# Patient Record
Sex: Male | Born: 1985 | Race: Black or African American | Hispanic: No | Marital: Single | Smoking: Former smoker
Health system: Southern US, Community
[De-identification: ages and names within clinical notes are randomized; demographics above are authoritative.]

## PROBLEM LIST (undated history)

## (undated) DIAGNOSIS — N200 Calculus of kidney: Secondary | ICD-10-CM

## (undated) DIAGNOSIS — I10 Essential (primary) hypertension: Secondary | ICD-10-CM

---

## 2010-07-22 ENCOUNTER — Emergency Department (HOSPITAL_COMMUNITY)
Admission: EM | Admit: 2010-07-22 | Discharge: 2010-07-22 | Payer: Self-pay | Source: Home / Self Care | Admitting: Emergency Medicine

## 2011-09-15 ENCOUNTER — Emergency Department (HOSPITAL_COMMUNITY)
Admission: EM | Admit: 2011-09-15 | Discharge: 2011-09-15 | Disposition: A | Discharge status: Home / Self Care | Payer: Managed Care, Other (non HMO) | Attending: Emergency Medicine | Admitting: Emergency Medicine

## 2011-09-15 ENCOUNTER — Encounter (HOSPITAL_COMMUNITY): Payer: Self-pay | Admitting: *Deleted

## 2011-09-15 DIAGNOSIS — N390 Urinary tract infection, site not specified: Secondary | ICD-10-CM | POA: Insufficient documentation

## 2011-09-15 DIAGNOSIS — R300 Dysuria: Secondary | ICD-10-CM | POA: Insufficient documentation

## 2011-09-15 LAB — URINALYSIS, ROUTINE W REFLEX MICROSCOPIC
Glucose, UA: NEGATIVE mg/dL
Nitrite: NEGATIVE
Protein, ur: NEGATIVE mg/dL
Urobilinogen, UA: 0.2 mg/dL (ref 0.0–1.0)

## 2011-09-15 LAB — URINE MICROSCOPIC-ADD ON

## 2011-09-15 MED ORDER — CEPHALEXIN 500 MG PO CAPS: 500.0000 mg | ORAL_CAPSULE | Freq: Four times a day (QID) | ORAL | Status: AC

## 2011-09-15 NOTE — ED Provider Notes (Signed)
Medical screening examination/treatment/procedure(s) were performed by non-physician practitioner and as supervising physician I was immediately available for consultation/collaboration.  Toy Baker, MD 09/15/11 [phone]

## 2011-09-15 NOTE — Discharge Instructions (Signed)
Your urine appears infected. I have sent cultures, if they return and you require additional treatment, you will be contacted by phone. Take keflex as prescribed until all gone. Follow up with your doctor in 1 week for recheck. Return if worsening.   Urinary Tract Infection Infections of the urinary tract can start in several places. A bladder infection (cystitis), a kidney infection (pyelonephritis), and a prostate infection (prostatitis) are different types of urinary tract infections (UTIs). They usually get better if treated with medicines (antibiotics) that kill germs. Take all the medicine until it is gone. You or your child may feel better in a few days, but TAKE ALL MEDICINE or the infection may not respond and may become more difficult to treat. HOME CARE INSTRUCTIONS   Drink enough water and fluids to keep the urine clear or pale yellow. Cranberry juice is especially recommended, in addition to large amounts of water.   Avoid caffeine, tea, and carbonated beverages. They tend to irritate the bladder.   Alcohol may irritate the prostate.   Only take over-the-counter or prescription medicines for pain, discomfort, or fever as directed by your caregiver.  To prevent further infections:  Empty the bladder often. Avoid holding urine for long periods of time.   After a bowel movement, women should cleanse from front to back. Use each tissue only once.   Empty the bladder before and after sexual intercourse.  FINDING OUT THE RESULTS OF YOUR TEST Not all test results are available during your visit. If your or your child's test results are not back during the visit, make an appointment with your caregiver to find out the results. Do not assume everything is normal if you have not heard from your caregiver or the medical facility. It is important for you to follow up on all test results. SEEK MEDICAL CARE IF:   There is back pain.   Your baby is older than 3 months with a rectal  temperature of 100.5 F (38.1 C) or higher for more than 1 day.   Your or your child's problems (symptoms) are no better in 3 days. Return sooner if you or your child is getting worse.  SEEK IMMEDIATE MEDICAL CARE IF:   There is severe back pain or lower abdominal pain.   You or your child develops chills.   You have a fever.   Your baby is older than 3 months with a rectal temperature of 102 F (38.9 C) or higher.   Your baby is 38 months old or younger with a rectal temperature of 100.4 F (38 C) or higher.   There is nausea or vomiting.   There is continued burning or discomfort with urination.  MAKE SURE YOU:   Understand these instructions.   Will watch your condition.   Will get help right away if you are not doing well or get worse.  Document Released: 03/20/2005 Document Revised: 05/30/2011 Document Reviewed: 10/23/2006 [facility] Patient Information 2012 [place], [place].

## 2011-09-15 NOTE — ED Provider Notes (Signed)
History     CSN: [id number]  Arrival date & time 09/15/11  1924   First MD Initiated Contact with Patient 09/15/11 2107      Chief Complaint  Patient presents with  . Dysuria    burns when he urinates,  no discharge noted,  no unprotected sex    (Consider location/radiation/quality/duration/timing/severity/associated sxs/prior treatment) Patient is a 26 y.o. male presenting with dysuria. The history is provided by the patient.  Dysuria  This is a new problem. The current episode started 6 to 12 hours ago. The problem occurs every urination. The problem has not changed since onset.The quality of the pain is described as burning. The pain is at a severity of 0/10. There has been no fever. Pertinent negatives include no chills, no vomiting, no discharge, no frequency, no hematuria, no hesitancy and no flank pain.  Pt states burning with urination started today. He states he has not had an intercourse in "years." Denies penile discharge. Denies testicular pain. Denies abdominal pain, back pain, flank pain.   No past medical history on file.  No past surgical history on file.  History reviewed. No pertinent family history.  History  Substance Use Topics  . Smoking status: Not on file  . Smokeless tobacco: Not on file  . Alcohol Use: No      Review of Systems  Constitutional: Negative for fever and chills.  HENT: Negative.   Eyes: Negative.   Respiratory: Negative.   Cardiovascular: Negative.   Gastrointestinal: Negative.  Negative for vomiting.  Genitourinary: Positive for dysuria. Negative for hesitancy, frequency, hematuria, flank pain, discharge, penile swelling and penile pain.  Musculoskeletal: Negative.   Skin: Negative.   Neurological: Negative.   Psychiatric/Behavioral: Negative.     Allergies  Review of patient's allergies indicates no known allergies.  Home Medications  No current outpatient prescriptions on file.  BP 165/76  Pulse 82  Temp(Src) 99.7 F  (37.6 C) (Oral)  Resp 20  SpO2 100%  Physical Exam  Nursing note and vitals reviewed. Constitutional: He is oriented to person, place, and time. He appears well-developed and well-nourished.  HENT:  Head: Normocephalic.  Eyes: Conjunctivae are normal.  Neck: Neck supple.  Cardiovascular: Normal rate, regular rhythm and normal heart sounds.   Pulmonary/Chest: Effort normal and breath sounds normal. No respiratory distress.  Abdominal: Soft. Bowel sounds are normal. He exhibits no distension. There is no tenderness.  Genitourinary: Penis normal.  Musculoskeletal: Normal range of motion.  Neurological: He is alert and oriented to person, place, and time.  Skin: Skin is warm and dry.  Psychiatric: He has a normal mood and affect.    ED Course  Procedures (including critical care time)  UA with 11-20 WBCs, will treat for UTI. Pt denies being sexually active. Sent GC/chlamydia and urine cultures. Explained if they return abnormal he will be contacted and he will need to be treated. Pt agrees with the plan. He has no fever, chills, nausea, vomiting, abdominal pain, flank pain. Results for orders placed during the hospital encounter of 09/15/11  URINALYSIS, ROUTINE W REFLEX MICROSCOPIC      Component Value Range   Color, Urine YELLOW  YELLOW    APPearance CLOUDY (*) CLEAR    Specific Gravity, Urine 1.020  1.005 - 1.030    pH 7.0  5.0 - 8.0    Glucose, UA NEGATIVE  NEGATIVE (mg/dL)   Hgb urine dipstick NEGATIVE  NEGATIVE    Bilirubin Urine NEGATIVE  NEGATIVE    Ketones,  ur NEGATIVE  NEGATIVE (mg/dL)   Protein, ur NEGATIVE  NEGATIVE (mg/dL)   Urobilinogen, UA 0.2  0.0 - 1.0 (mg/dL)   Nitrite NEGATIVE  NEGATIVE    Leukocytes, UA SMALL (*) NEGATIVE   URINE MICROSCOPIC-ADD ON      Component Value Range   WBC, UA 11-20  <3 (WBC/hpf)   No results found.    No diagnosis found.    MDM          Lottie Mussel, PA 09/15/11 2244

## 2011-09-15 NOTE — ED Notes (Signed)
Only burns when he urinates,  Started at church this morning,  No unprotected sex,  No discharge noted

## 2011-09-17 LAB — URINE CULTURE

## 2011-09-19 NOTE — ED Notes (Signed)
+   Gonorrhea. Patient not treated. Chart sent to EDP office for review.

## 2011-09-20 NOTE — ED Notes (Signed)
RX called to [facility] on [address] [phone]). RX called in by [facility].

## 2013-07-04 ENCOUNTER — Ambulatory Visit: Payer: Self-pay | Admitting: Family Medicine

## 2013-07-04 VITALS — BP 124/82 | HR 103 | Temp 99.4°F | Resp 18 | Ht 77.5 in | Wt 332.4 lb

## 2013-07-04 DIAGNOSIS — J029 Acute pharyngitis, unspecified: Secondary | ICD-10-CM

## 2013-07-04 MED ORDER — AMOXICILLIN-POT CLAVULANATE 875-125 MG PO TABS: 1.0000 | ORAL_TABLET | Freq: Two times a day (BID) | ORAL | Status: DC

## 2013-07-04 NOTE — Progress Notes (Signed)
Patient ID: Charles CousinsMichael Barry MRN: [id number], DOB: 02-21-1986, 28 y.o. Date of Encounter: 07/04/2013, 1:04 PM This chart was scribed for Elvina SidleKurt Lauenstein, MD by Nicholos Johnsenise Iheanachor, Medical Scribe. This patient's care was started at 1:04 PM  Primary Physician: Provider Not In System  Chief Complaint: sore throat, nasal congestion  HPI: 28 y.o. year old male with history below presents with sore throat and associated ear pain and mucosal production, onset 3 days ago. Pt has been taking some OTC remedies with no relief. Pt took 1-2 amoxicillin pills he got from a friend that provided mild relief. Denies cough, abdominal pain. Pt had a sick contact that tested positive for Strep throat about a month ago. Pt works for [employer].  History reviewed. No pertinent past medical history.   Home Meds: Prior to Admission medications   Medication Sig Start Date End Date Taking? Authorizing Provider  acetaminophen (TYLENOL) 500 MG tablet Take 500 mg by mouth every 6 (six) hours as needed.   Yes Historical Provider, MD    Allergies: No Known Allergies  History   Social History   Marital Status: Single    Spouse Name: N/A    Number of Children: N/A   Years of Education: N/A   Occupational History   Not on file.   Social History Main Topics   Smoking status: Former Smoker -- 0.00 packs/day   Smokeless tobacco: Not on file   Alcohol Use: No   Drug Use: No   Sexual Activity: No   Other Topics Concern   Not on file   Social History Narrative   No narrative on file     Review of Systems: Constitutional: negative for chills, fever, night sweats, weight changes, or fatigue  HEENT: negative for vision changes, hearing loss, rhinorrhea, ST, epistaxis, or sinus pressure. Positive for sore throat and nasal congestion. Cardiovascular: negative for chest pain or palpitations Respiratory: negative for hemoptysis, wheezing, shortness of breath, or cough Abdominal: negative  for abdominal pain, nausea, vomiting, diarrhea, or constipation Dermatological: negative for rash Neurologic: negative for headache, dizziness, or syncope All other systems reviewed and are otherwise negative with the exception to those above and in the HPI.   Physical Exam Blood pressure 124/82, pulse 103, temperature 99.4 F (37.4 C), temperature source Oral, resp. rate 18, height 6' 5.5" (1.969 m), weight 332 lb 6.4 oz (150.776 kg), SpO2 99.00%., Body mass index is 38.89 kg/(m^2). General: Well developed, well nourished, in no acute distress. Head: Normocephalic, atraumatic, eyes without discharge, sclera non-icteric, nares are without discharge. Bilateral auditory canals clear, TM's are without perforation, pearly grey and translucent with reflective cone of light bilaterally. Oral cavity moist, posterior pharynx without exudate, but he does have erythema and post nasal drip. There is some swelling left posterior pharynx. Neck: Supple. No thyromegaly. Full ROM. Mild left anterior cervical lymphadenopathy. Lungs: Clear bilaterally to auscultation without wheezes, rales, or rhonchi. Breathing is unlabored. Heart: RRR with S1 S2. No murmurs, rubs, or gallops appreciated. Abdomen: Soft, non-tender, non-distended with normoactive bowel sounds. No hepatomegaly. No rebound/guarding. No obvious abdominal masses. Msk:  Strength and tone normal for age. Extremities/Skin: Warm and dry. No clubbing or cyanosis. No edema. No rashes or suspicious lesions. Neuro: Alert and oriented X 3. Moves all extremities spontaneously. Gait is normal. CNII-XII grossly in tact. Psych:  Responds to questions appropriately with a normal affect.   Labs:   ASSESSMENT AND PLAN:  28 y.o. year old male with Acute pharyngitis - Plan: amoxicillin-clavulanate (AUGMENTIN)  875-125 MG per tablet  Signed, Elvina Sidle, MD 07/04/2013 12:59 PM  HPI Review of Systems  Physical Exam

## 2013-07-04 NOTE — Patient Instructions (Signed)
Pharyngitis °Pharyngitis is redness, pain, and swelling (inflammation) of your pharynx.  °CAUSES  °Pharyngitis is usually caused by infection. Most of the time, these infections are from viruses (viral) and are part of a cold. However, sometimes pharyngitis is caused by bacteria (bacterial). Pharyngitis can also be caused by allergies. Viral pharyngitis may be spread from person to person by coughing, sneezing, and personal items or utensils (cups, forks, spoons, toothbrushes). Bacterial pharyngitis may be spread from person to person by more intimate contact, such as kissing.  °SIGNS AND SYMPTOMS  °Symptoms of pharyngitis include:   °· Sore throat.   °· Tiredness (fatigue).   °· Low-grade fever.   °· Headache. °· Joint pain and muscle aches. °· Skin rashes. °· Swollen lymph nodes. °· Plaque-like film on throat or tonsils (often seen with bacterial pharyngitis). °DIAGNOSIS  °Your health care provider will ask you questions about your illness and your symptoms. Your medical history, along with a physical exam, is often all that is needed to diagnose pharyngitis. Sometimes, a rapid strep test is done. Other lab tests may also be done, depending on the suspected cause.  °TREATMENT  °Viral pharyngitis will usually get better in 3 4 days without the use of medicine. Bacterial pharyngitis is treated with medicines that kill germs (antibiotics).  °HOME CARE INSTRUCTIONS  °· Drink enough water and fluids to keep your urine clear or pale yellow.   °· Only take over-the-counter or prescription medicines as directed by your health care provider:   °· If you are prescribed antibiotics, make sure you finish them even if you start to feel better.   °· Do not take aspirin.   °· Get lots of rest.   °· Gargle with 8 oz of salt water (½ tsp of salt per 1 qt of water) as often as every 1 2 hours to soothe your throat.   °· Throat lozenges (if you are not at risk for choking) or sprays may be used to soothe your throat. °SEEK MEDICAL  CARE IF:  °· You have large, tender lumps in your neck. °· You have a rash. °· You cough up green, yellow-brown, or bloody spit. °SEEK IMMEDIATE MEDICAL CARE IF:  °· Your neck becomes stiff. °· You drool or are unable to swallow liquids. °· You vomit or are unable to keep medicines or liquids down. °· You have severe pain that does not go away with the use of recommended medicines. °· You have trouble breathing (not caused by a stuffy nose). °MAKE SURE YOU:  °· Understand these instructions. °· Will watch your condition. °· Will get help right away if you are not doing well or get worse. °Document Released: 06/10/2005 Document Revised: 03/31/2013 Document Reviewed: 02/15/2013 °Exit[employer]® Patient Information ©2014 [employer]. ° °

## 2013-10-25 ENCOUNTER — Other Ambulatory Visit: Payer: Self-pay | Admitting: Family Medicine

## 2013-10-26 ENCOUNTER — Telehealth: Payer: Self-pay

## 2013-10-26 NOTE — Telephone Encounter (Signed)
Patient is experiencing allergy symptoms. Would like another amoxicillin RX if possible. He knows this has been denied and that he needs an OV before he can get this med. He wants to know if an exception can be made.  [phone]

## 2013-10-26 NOTE — Telephone Encounter (Signed)
Spoke with pt. Recommended suggestions for allergies. He is already taking Zyrtec. Advised Nasacort and Mucinex. Will try this and will come in if this doesn't help.

## 2013-10-26 NOTE — Telephone Encounter (Signed)
Unfortunately not. Pt must RTC. Unable to LM. VM not set up

## 2013-10-27 ENCOUNTER — Ambulatory Visit (INDEPENDENT_AMBULATORY_CARE_PROVIDER_SITE_OTHER): Payer: BC Managed Care – PPO | Admitting: Physician Assistant

## 2013-10-27 VITALS — BP 120/76 | HR 91 | Temp 100.1°F | Resp 16 | Ht 76.5 in | Wt 341.0 lb

## 2013-10-27 DIAGNOSIS — J329 Chronic sinusitis, unspecified: Secondary | ICD-10-CM

## 2013-10-27 MED ORDER — IPRATROPIUM BROMIDE 0.03 % NA SOLN: 2.0000 | Freq: Two times a day (BID) | NASAL | Status: DC

## 2013-10-27 MED ORDER — GUAIFENESIN ER 1200 MG PO TB12: 1.0000 | ORAL_TABLET | Freq: Two times a day (BID) | ORAL | Status: DC | PRN

## 2013-10-27 MED ORDER — AMOXICILLIN 875 MG PO TABS: 1750.0000 mg | ORAL_TABLET | Freq: Two times a day (BID) | ORAL | Status: DC

## 2013-10-27 NOTE — Patient Instructions (Signed)
Get plenty of rest and drink at least 64 ounces of water daily. You may continue the Zyrtec, Emergen-C, etc.

## 2013-10-27 NOTE — Progress Notes (Signed)
   Subjective:    Patient ID: Charles CousinsMichael Cutler, male    DOB: [birth year], 28 y.o.   MRN: [id number]  Sinusitis Associated symptoms include congestion, ear pain, headaches and a sore throat. Pertinent negatives include no chills, coughing, diaphoresis or shortness of breath.    28y.o otherwise healthy male presents with 4 days of congestion and sore throat.  Started with congestion and HA and progressed to sore throat with congestion.  Associated ear pressure and rhinorrhea.  Has tried nyquil, emergency C and tylenol.  Has been allowing more time for sleep and has increase intake of water.  Took amoxicillin that was left over from january visit, which "almost cleared infection."   Denies generalized body aches, cough, SOB, loss of appetite, N/V/D, abd pain.  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    Review of Systems  Constitutional: Positive for fever. Negative for chills, diaphoresis, appetite change and fatigue.  HENT: Positive for congestion, ear pain, postnasal drip and sore throat.   Eyes: Negative.   Respiratory: Negative for cough, chest tightness, shortness of breath and wheezing.   Cardiovascular: Negative for chest pain and leg swelling.  Gastrointestinal: Negative for nausea, vomiting, abdominal pain and diarrhea.  Endocrine: Negative.   Genitourinary: Negative.   Musculoskeletal: Negative.   Skin: Negative for rash.  Allergic/Immunologic: Negative.   Neurological: Positive for headaches. Negative for dizziness and light-headedness.  Hematological: Negative.        Objective:   Physical Exam  Constitutional: He is oriented to person, place, and time. He appears well-developed and well-nourished. No distress.  BP 120/76  Pulse 91  Temp(Src) 100.1 F (37.8 C) (Oral)  Resp 16  Ht 6' 4.5" (1.943 m)  Wt 341 lb (154.677 kg)  BMI 40.97 kg/m2  SpO2 96%   HENT:  Head: Normocephalic.  Right Ear: Tympanic membrane and external ear normal.   Left Ear: Tympanic membrane and external ear normal.  Nose: Mucosal edema present.  Mouth/Throat: Oropharyngeal exudate, posterior oropharyngeal edema and posterior oropharyngeal erythema present.  Eyes: Conjunctivae are normal. Pupils are equal, round, and reactive to light.  Neck: Normal range of motion.  Cardiovascular: Normal rate, regular rhythm, normal heart sounds and intact distal pulses.  Exam reveals no gallop and no friction rub.   No murmur heard. Pulmonary/Chest: Effort normal and breath sounds normal. No respiratory distress. He has no wheezes. He exhibits no tenderness.  Lymphadenopathy:    He has cervical adenopathy.  Neurological: He is alert and oriented to person, place, and time.  Skin: Skin is warm and dry. No rash noted.  Psychiatric: He has a normal mood and affect. His behavior is normal.          Assessment & Plan:   1. Sinusitis Probable sinus infection.  Pt will return if symptoms fail to improve after 10 days of symptom onset. - ipratropium (ATROVENT) 0.03 % nasal spray; Place 2 sprays into both nostrils 2 (two) times daily.  Dispense: 30 mL; Refill: 0 - Guaifenesin (MUCINEX MAXIMUM STRENGTH) 1200 MG TB12; Take 1 tablet (1,200 mg total) by mouth every 12 (twelve) hours as needed.  Dispense: 14 tablet; Refill: 1 - amoxicillin (AMOXIL) 875 MG tablet; Take 2 tablets (1,750 mg total) by mouth 2 (two) times daily.  Dispense: 20 tablet; Refill: 0

## 2013-10-30 NOTE — Progress Notes (Signed)
I have examined this patient along with the student and agree.  Discussed the importance of completing the course of antibiotics, not leaving any left over, not taking any remaining tablets without instruction by a provider, and returning to the office should his symptoms persist after treatment.

## 2014-03-28 ENCOUNTER — Ambulatory Visit (INDEPENDENT_AMBULATORY_CARE_PROVIDER_SITE_OTHER): Payer: BC Managed Care – PPO | Admitting: Family Medicine

## 2014-03-28 VITALS — BP 128/70 | HR 90 | Temp 97.9°F | Resp 16 | Ht 77.5 in | Wt 352.0 lb

## 2014-03-28 DIAGNOSIS — L209 Atopic dermatitis, unspecified: Secondary | ICD-10-CM

## 2014-03-28 MED ORDER — CLOBETASOL PROPIONATE 0.05 % EX CREA: 1.0000 "application " | TOPICAL_CREAM | Freq: Two times a day (BID) | CUTANEOUS | Status: DC

## 2014-03-28 NOTE — Patient Instructions (Signed)
General measures for eczema - In clinical experience, avoidance of irritants or exacerbating factors is beneficial for most patients with eczema. General skin care measures aimed at reducing skin irritation and restoring the skin barrier include:  Using lukewarm water and soap-free cleansers to wash hands  Drying hands thoroughly after washing  Applying emollients (eg, petroleum jelly) immediately after hand drying and as often as possible  Wearing cotton gloves under vinyl or other nonlatex gloves when performing wet work  Removing rings and watches and bracelets before wet work  Wearing protective gloves in cold weather  Wearing task-specific gloves for frictional exposures (eg, gardening, carpentry)  Avoiding exposure to irritants (eg, detergents, solvents, hair lotions or dyes, acidic foods [eg, citrus fruit])   Apply the clobetasol cream to your leg and spots on arm until those rashes are completely gone and then switch over to good hypoallergenic thick moisturizer cream such as Eucerin, Cedaphil, or Aquaphor and apply this continually twice a day - especially immediately after showering to prevent it from coming back.   Eczema Eczema, also called atopic dermatitis, is a skin disorder that causes inflammation of the skin. It causes a red rash and dry, scaly skin. The skin becomes very itchy. Eczema is generally worse during the cooler winter months and often improves with the warmth of summer. Eczema usually starts showing signs in infancy. Some children outgrow eczema, but it may last through adulthood.  CAUSES  The exact cause of eczema is not known, but it appears to run in families. People with eczema often have a family history of eczema, allergies, asthma, or hay fever. Eczema is not contagious. Flare-ups of the condition may be caused by:   Contact with something you are sensitive or allergic to.   Stress. SIGNS AND SYMPTOMS  Dry, scaly skin.   Red, itchy rash.   Itchiness.  This may occur before the skin rash and may be very intense.  DIAGNOSIS  The diagnosis of eczema is usually made based on symptoms and medical history. TREATMENT  Eczema cannot be cured, but symptoms usually can be controlled with treatment and other strategies. A treatment plan might include:  Controlling the itching and scratching.   Use over-the-counter antihistamines as directed for itching. This is especially useful at night when the itching tends to be worse.   Use over-the-counter steroid creams as directed for itching.   Avoid scratching. Scratching makes the rash and itching worse. It may also result in a skin infection (impetigo) due to a break in the skin caused by scratching.   Keeping the skin well moisturized with creams every day. This will seal in moisture and help prevent dryness. Lotions that contain alcohol and water should be avoided because they can dry the skin.   Limiting exposure to things that you are sensitive or allergic to (allergens).   Recognizing situations that cause stress.   Developing a plan to manage stress.  HOME CARE INSTRUCTIONS   Only take over-the-counter or prescription medicines as directed by your health care provider.   Do not use anything on the skin without checking with your health care provider.   Keep baths or showers short (5 minutes) in warm (not hot) water. Use mild cleansers for bathing. These should be unscented. You may add nonperfumed bath oil to the bath water. It is best to avoid soap and bubble bath.   Immediately after a bath or shower, when the skin is still damp, apply a moisturizing ointment to the entire body.  This ointment should be a petroleum ointment. This will seal in moisture and help prevent dryness. The thicker the ointment, the better. These should be unscented.   Keep fingernails cut short. Children with eczema may need to wear soft gloves or mittens at night after applying an ointment.   Dress  in clothes made of cotton or cotton blends. Dress lightly, because heat increases itching.   A child with eczema should stay away from anyone with fever blisters or cold sores. The virus that causes fever blisters (herpes simplex) can cause a serious skin infection in children with eczema. SEEK MEDICAL CARE IF:   Your itching interferes with sleep.   Your rash gets worse or is not better within 1 week after starting treatment.   You see pus or soft yellow scabs in the rash area.   You have a fever.   You have a rash flare-up after contact with someone who has fever blisters.  Document Released: 06/07/2000 Document Revised: 03/31/2013 Document Reviewed: 01/11/2013 [facility] Patient Information 2015 [employer] [place]. This information is not intended to replace advice given to you by your health care provider. Make sure you discuss any questions you have with your health care provider.

## 2014-03-30 ENCOUNTER — Telehealth: Payer: Self-pay

## 2014-03-30 NOTE — Telephone Encounter (Signed)
[facility] lists approximate retail price of 60 g of this product as $70-90/tube. That's still quite expensive, but much less than what he's being charged.  Does he have a high deductible on prescription benefits? (he's listed as self-pay for the visit here 03/28/14, but the pharmacy has him with insurance). If he HAS insurance, he should contact the pharmacy benefit manager number on his card and inquire.  Perhaps they have a preferred product that is a lower cost?  In the meantime, he can use OTC hydrocortisone cream/ointment.

## 2014-03-30 NOTE — Telephone Encounter (Signed)
Patient went to [facility] to get his prescription "Clobetasol Propionate" and it will cost him $193. Patient stated he can not afford this amount and requesting a different medication to be sent to [facility] on [address]. Patients call back number is [phone].

## 2014-03-31 MED ORDER — TRIAMCINOLONE ACETONIDE 0.1 % EX CREA
1.0000 | TOPICAL_CREAM | Freq: Four times a day (QID) | CUTANEOUS | Status: DC
Start: 2014-03-31 — End: 2015-06-06

## 2014-03-31 NOTE — Progress Notes (Signed)
Subjective:  This chart was scribed for Norberto Sorenson by Elon Spanner, Medical Scribe. This patient was seen in room Rm 10 and the patient's care was started at 6:37 PM.   Patient ID: Charles Barry, male    DOB: 1985-12-27, 28 y.o.   MRN: [id number] Chief Complaint  Patient presents with  . Rash     Rash   HPI Comments: Charles Barry is a 28 y.o. male who presents to the Emergency Department complaining of an improving itchy patches of rash on his leg and right arm with recent improvement onset 2 weeks ago.  Patient reports he was seen for this complaint by nurses at his [facility] who advised him that it could be eczema and told him to use a cold, damp towel on the area infused with aquafor and cortisone.  He noticed improvement with this treatment but decided to come to [facility] today to have his rash looked at.  Patient denies prior similar episodes.  Patient states he may have changed his body wash prior to the rash, however, he recently changed back to an oatmeal scrub wash.  Patient denies history of eczema as a child.  Patient denies having a new pet.  Patient denies blistering, bumps, pustules.  Patient reports he has had abnormal allergies this year and is concerned his complaint could be an allergy  History reviewed. No pertinent past medical history. Current Outpatient Prescriptions on File Prior to Visit  Medication Sig Dispense Refill  . acetaminophen (TYLENOL) 500 MG tablet Take 500 mg by mouth every 6 (six) hours as needed.      . Guaifenesin (MUCINEX MAXIMUM STRENGTH) 1200 MG TB12 Take 1 tablet (1,200 mg total) by mouth every 12 (twelve) hours as needed.  14 tablet  1  . ipratropium (ATROVENT) 0.03 % nasal spray Place 2 sprays into both nostrils 2 (two) times daily.  30 mL  0   No current facility-administered medications on file prior to visit.   No Known Allergies    Review of Systems  Skin: Positive for rash.       Objective:  BP 128/70  Pulse 90  Temp(Src) 97.9  F (36.6 C) (Oral)  Resp 16  Ht 6' 5.5" (1.969 m)  Wt 352 lb (159.666 kg)  BMI 41.18 kg/m2  SpO2 99%  Physical Exam  Nursing note and vitals reviewed. Constitutional: He is oriented to person, place, and time. He appears well-developed and well-nourished. No distress.  HENT:  Head: Normocephalic and atraumatic.  Eyes: Conjunctivae and EOM are normal.  Neck: Neck supple. No tracheal deviation present.  Cardiovascular: Normal rate.   Pulmonary/Chest: Effort normal. No respiratory distress.  Musculoskeletal: Normal range of motion.  Neurological: He is alert and oriented to person, place, and time.  Skin: Skin is warm and dry.  Hyperpigmented, rough, mildly elevated plaque with small amount of scale, round well-circumscribed lesions.    Psychiatric: He has a normal mood and affect. His behavior is normal.          Assessment & Plan:  6:46 PM Advised patient he will be prescribed steroid cream to be used twice per day immediately after cold shower.  Discontinue once ebbing of his rash observed.  After this time, patient is advised to continue to use a thick moisturizer.   Atopic dermatitis ADDENDUM:  Clobetasol to expensive so pt switched to triamcinolone.  Meds ordered this encounter  Medications  . cetirizine (ZYRTEC) 10 MG chewable tablet    Sig: Chew 10 mg  by mouth daily.  . Ascorbic Acid (VITAMIN C) 1000 MG tablet    Sig: Take 1,000 mg by mouth daily.  . clobetasol cream (TEMOVATE) 0.05 %    Sig: Apply 1 application topically 2 (two) times daily.    Dispense:  60 g    Refill:  1    I personally performed the services described in this documentation, which was scribed in my presence. The recorded information has been reviewed and considered, and addended by me as needed.  Norberto SorensonEva Dorena Dorfman, MD MPH

## 2014-03-31 NOTE — Telephone Encounter (Signed)
Fine to try whatever steroid cream his insurance will approve for his eczema.  Sent triamcinolone to his pharmacy - they will usually pay for that though not as strong as clobetasol so RTC if not working or call insurance to see what is on his formulary for steroid creams.

## 2014-03-31 NOTE — Telephone Encounter (Signed)
Pt.notified

## 2015-06-06 ENCOUNTER — Ambulatory Visit (INDEPENDENT_AMBULATORY_CARE_PROVIDER_SITE_OTHER): Payer: BLUE CROSS/BLUE SHIELD

## 2015-06-06 ENCOUNTER — Ambulatory Visit (INDEPENDENT_AMBULATORY_CARE_PROVIDER_SITE_OTHER): Payer: BLUE CROSS/BLUE SHIELD | Admitting: Physician Assistant

## 2015-06-06 VITALS — BP 130/70 | HR 96 | Temp 99.0°F | Resp 20 | Ht 78.0 in | Wt 375.0 lb

## 2015-06-06 DIAGNOSIS — Z760 Encounter for issue of repeat prescription: Secondary | ICD-10-CM

## 2015-06-06 DIAGNOSIS — R0602 Shortness of breath: Secondary | ICD-10-CM | POA: Diagnosis not present

## 2015-06-06 DIAGNOSIS — R0981 Nasal congestion: Secondary | ICD-10-CM | POA: Diagnosis not present

## 2015-06-06 DIAGNOSIS — Z8249 Family history of ischemic heart disease and other diseases of the circulatory system: Secondary | ICD-10-CM

## 2015-06-06 LAB — POCT CBC
GRANULOCYTE PERCENT: 58.9 % (ref 37–80)
HEMATOCRIT: 46.6 % (ref 43.5–53.7)
HEMOGLOBIN: 15.5 g/dL (ref 14.1–18.1)
LYMPH, POC: 2.7 (ref 0.6–3.4)
MCH: 28.5 pg (ref 27–31.2)
MCHC: 33.2 g/dL (ref 31.8–35.4)
MCV: 85.8 fL (ref 80–97)
MID (cbc): 0.4 (ref 0–0.9)
MPV: 7.2 fL (ref 0–99.8)
POC GRANULOCYTE: 4.5 (ref 2–6.9)
POC LYMPH PERCENT: 35.4 %L (ref 10–50)
POC MID %: 5.7 % (ref 0–12)
Platelet Count, POC: 209 10*3/uL (ref 142–424)
RBC: 5.43 M/uL (ref 4.69–6.13)
RDW, POC: 13.9 %
WBC: 7.7 10*3/uL (ref 4.6–10.2)

## 2015-06-06 LAB — POCT GLYCOSYLATED HEMOGLOBIN (HGB A1C): HEMOGLOBIN A1C: 5.6

## 2015-06-06 MED ORDER — TRIAMCINOLONE ACETONIDE 0.1 % EX CREA: 1.0000 "application " | TOPICAL_CREAM | Freq: Four times a day (QID) | CUTANEOUS | Status: DC

## 2015-06-06 NOTE — Addendum Note (Signed)
Addended by: Ofilia NeasLARK, Lasean L on: 06/06/2015 09:18 PM   Modules accepted: Orders

## 2015-06-06 NOTE — Patient Instructions (Signed)
Obesity Obesity is defined as having too much total body fat and a body mass index (BMI) of 30 or more. BMI is an estimate of body fat and is calculated from your height and weight. BMI is typically calculated by your health care provider during regular wellness visits. Obesity happens when you consume more calories than you can burn by exercising or performing daily physical tasks. Prolonged obesity can cause major illnesses or emergencies, such as:  Stroke.  Heart disease.  Diabetes.  Cancer.  Arthritis.  High blood pressure (hypertension).  High cholesterol.  Sleep apnea.  Erectile dysfunction.  Infertility problems. CAUSES   Regularly eating unhealthy foods.  Physical inactivity.  Certain disorders, such as an underactive thyroid (hypothyroidism), Cushing's syndrome, and polycystic ovarian syndrome.  Certain medicines, such as steroids, some depression medicines, and antipsychotics.  Genetics.  Lack of sleep. DIAGNOSIS A health care provider can diagnose obesity after calculating your BMI. Obesity will be diagnosed if your BMI is 30 or higher. There are other methods of measuring obesity levels. Some other methods include measuring your skinfold thickness, your waist circumference, and comparing your hip circumference to your waist circumference. TREATMENT  A healthy treatment program includes some or all of the following:  Long-term dietary changes.  Exercise and physical activity.  Behavioral and lifestyle changes.  Medicine only under the supervision of your health care provider. Medicines may help, but only if they are used with diet and exercise programs. If your BMI is 40 or higher, your health care provider may recommend specialized surgery or programs to help with weight loss. An unhealthy treatment program includes:  Fasting.  Fad diets.  Supplements and drugs. These choices do not succeed in long-term weight control. HOME CARE  INSTRUCTIONS  Exercise and perform physical activity as directed by your health care provider. To increase physical activity, try the following:  Use stairs instead of elevators.  Park farther away from store entrances.  Garden, bike, or walk instead of watching television or using the computer.  Eat healthy, low-calorie foods and drinks on a regular basis. Eat more fruits and vegetables. Use low-calorie cookbooks or take healthy cooking classes.  Limit fast food, sweets, and processed snack foods.  Eat smaller portions.  Keep a daily journal of everything you eat. There are many free websites to help you with this. It may be helpful to measure your foods so you can determine if you are eating the correct portion sizes.  Avoid drinking alcohol. Drink more water and drinks without calories.  Take vitamins and supplements only as recommended by your health care provider.  Weight-loss support groups, registered dietitians, counselors, and stress reduction education can also be very helpful. SEEK IMMEDIATE MEDICAL CARE IF:  You have chest pain or tightness.  You have trouble breathing or feel short of breath.  You have weakness or leg numbness.  You feel confused or have trouble talking.  You have sudden changes in your vision.   This information is not intended to replace advice given to you by your health care provider. Make sure you discuss any questions you have with your health care provider.   Document Released: 07/18/2004 Document Revised: 07/01/2014 Document Reviewed: 07/17/2011 Elsevier Interactive Patient Education 2016 [employer]  

## 2015-06-06 NOTE — Progress Notes (Addendum)
06/06/2015 8:07 PM   DOB: May 24, 1986 / MRN: [id number]  SUBJECTIVE:  Charles Barry is a 29 y.o. male presenting for for the evaluation of congestion that worsened 4 days ago.  Associated symptoms include no other symptoms today and he denies fever and sore throat. Treatments tried thus far include vitamin C and benadryl4 with good  relief. He denies sick contacts.  He also complains of SOB that started last week.  Reports that he felt this was worse last night while singing in the choir.  He denies chest pain, diaphoresis, radicular pain and no dizziness.  He does not smoke.  Denies a history of blood clot.  Denies posterior leg pain and leg swelling. He denies an exertional component.  He father just had a heart attack last week and was 99% blocked and this was treated with one stent.  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  Admits that he is also very stressed.    He has No Known Allergies.   He  has no past medical history on file.    He  reports that he has quit smoking. He has never used smokeless tobacco. He reports that he drinks alcohol. He reports that he does not use illicit drugs. He  reports that he does not engage in sexual activity. The patient  has no past surgical history on file.  His family history includes Diabetes in his father and mother; Heart disease in his father.  Review of Systems  Constitutional: Negative for fever.  Respiratory: Positive for shortness of breath. Negative for cough, sputum production and wheezing.   Cardiovascular: Negative for chest pain, orthopnea, leg swelling and PND.  Gastrointestinal: Negative for nausea.  Skin: Negative for rash.  Neurological: Negative for dizziness and headaches.    Problem list and medications reviewed and updated by myself where necessary, and exist elsewhere in the encounter.   OBJECTIVE:  BP 130/70 mmHg  Pulse 96  Temp(Src) 99 F (37.2  C) (Oral)  Resp 20  Ht  (1.981 m)  Wt 375 lb (170.099 kg)  BMI 43.34 kg/m2  SpO2 99%  Physical Exam  Constitutional: He is oriented to person, place, and time. He appears well-developed. He does not appear ill.  HENT:  Right Ear: External ear normal.  Left Ear: External ear normal.  Nose: Nose normal.  Mouth/Throat: Oropharynx is clear and moist. No oropharyngeal exudate.  Eyes: Conjunctivae and EOM are normal. Pupils are equal, round, and reactive to light.  Cardiovascular: Normal rate.   Pulmonary/Chest: Effort normal.  Abdominal: He exhibits no distension.  Musculoskeletal: Normal range of motion.  Neurological: He is alert and oriented to person, place, and time. No cranial nerve deficit. Coordination normal.  Skin: Skin is warm and dry. He is not diaphoretic.  Psychiatric: He has a normal mood and affect.  Nursing note and vitals reviewed.   [facility] reading (PRIMARY) by  PA Jozalyn Baglio: STAT read please comment.    Results for orders placed or performed in visit on 06/06/15 (from the past 48 hour(s))  POCT CBC     Status: None   Collection Time: 06/06/15  8:04 PM  Result Value Ref Range   WBC 7.7 4.6 - 10.2 K/uL   Lymph, poc 2.7 0.6 - 3.4   POC LYMPH PERCENT 35.4 10 - 50 %L   MID (cbc) 0.4 0 - 0.9   POC MID % 5.7 0 -  12 %M   POC Granulocyte 4.5 2 - 6.9   Granulocyte percent 58.9 37 - 80 %G   RBC 5.43 4.69 - 6.13 M/uL   Hemoglobin 15.5 14.1 - 18.1 g/dL   HCT, POC 29.546.6 62.143.5 - 53.7 %   MCV 85.8 80 - 97 fL   MCH, POC 28.5 27 - 31.2 pg   MCHC 33.2 31.8 - 35.4 g/dL   RDW, POC 30.813.9 %   Platelet Count, POC 209 142 - 424 K/uL   MPV 7.2 0 - 99.8 fL  POCT glycosylated hemoglobin (Hb A1C)     Status: None   Collection Time: 06/06/15  8:04 PM  Result Value Ref Range   Hemoglobin A1C 5.6     ASSESSMENT AND PLAN:  Charles Barry was seen today for shortness of breath, cough and medication refill.  Diagnoses and all orders for this visit:  SOB (shortness of breath): His work up  is reassuring.  Will further stratify his risk given problem two.  If anything is questionable will send to cardiology for further evaluation.  His obesity may also be driving this problem.   -     EKG 12-Lead -     DG Chest 2 View; Future -     COMPLETE METABOLIC PANEL WITH GFR -     POCT CBC -     TSH -     Lipid panel -     POCT glycosylated hemoglobin (Hb A1C)  Family history of premature CAD: Will stratify his risk further with labs.    Sinus congestion: Advised Zyrtec daily.      The patient was advised to call or return to clinic if he does not see an improvement in symptoms or to seek the care of the closest emergency department if he worsens with the above plan.   Charles Barry, MHS, PA-C [facility] 06/06/2015 8:07 PM I have participated in the care of this patient with the Advanced Practice Provider and agree with Diagnosis and Plan as documented. Robert P. Merla Richesoolittle, M.D.

## 2015-06-07 LAB — COMPLETE METABOLIC PANEL WITH GFR
ALT: 55 U/L — ABNORMAL HIGH (ref 9–46)
AST: 31 U/L (ref 10–40)
Albumin: 4.5 g/dL (ref 3.6–5.1)
Alkaline Phosphatase: 77 U/L (ref 40–115)
BUN: 11 mg/dL (ref 7–25)
CHLORIDE: 102 mmol/L (ref 98–110)
CO2: 27 mmol/L (ref 20–31)
Calcium: 9.8 mg/dL (ref 8.6–10.3)
Creat: 0.97 mg/dL (ref 0.60–1.35)
GFR, Est African American: >89 mL/min (ref >=60)
GLUCOSE: 95 mg/dL (ref 65–99)
POTASSIUM: 4.3 mmol/L (ref 3.5–5.3)
SODIUM: 139 mmol/L (ref 135–146)
TOTAL PROTEIN: 8 g/dL (ref 6.1–8.1)
Total Bilirubin: 0.5 mg/dL (ref 0.2–1.2)

## 2015-06-07 LAB — LIPID PANEL
Cholesterol: 185 mg/dL (ref 125–200)
HDL: 51 mg/dL (ref >=40)
LDL CALC: 114 mg/dL (ref <130)
TRIGLYCERIDES: 100 mg/dL (ref <150)
Total CHOL/HDL Ratio: 3.6 Ratio (ref <=5.0)
VLDL: 20 mg/dL (ref <30)

## 2015-06-07 LAB — TSH: TSH: 3.134 u[IU]/mL (ref 0.350–4.500)

## 2017-01-06 IMAGING — CR DG CHEST 2V
4 series · 4 of 4 positions shown · non-contrast
Comparison: None.

CLINICAL DATA: Shortness of breath.

EXAM:
CHEST  2 VIEW

[PA (1 of 2)]
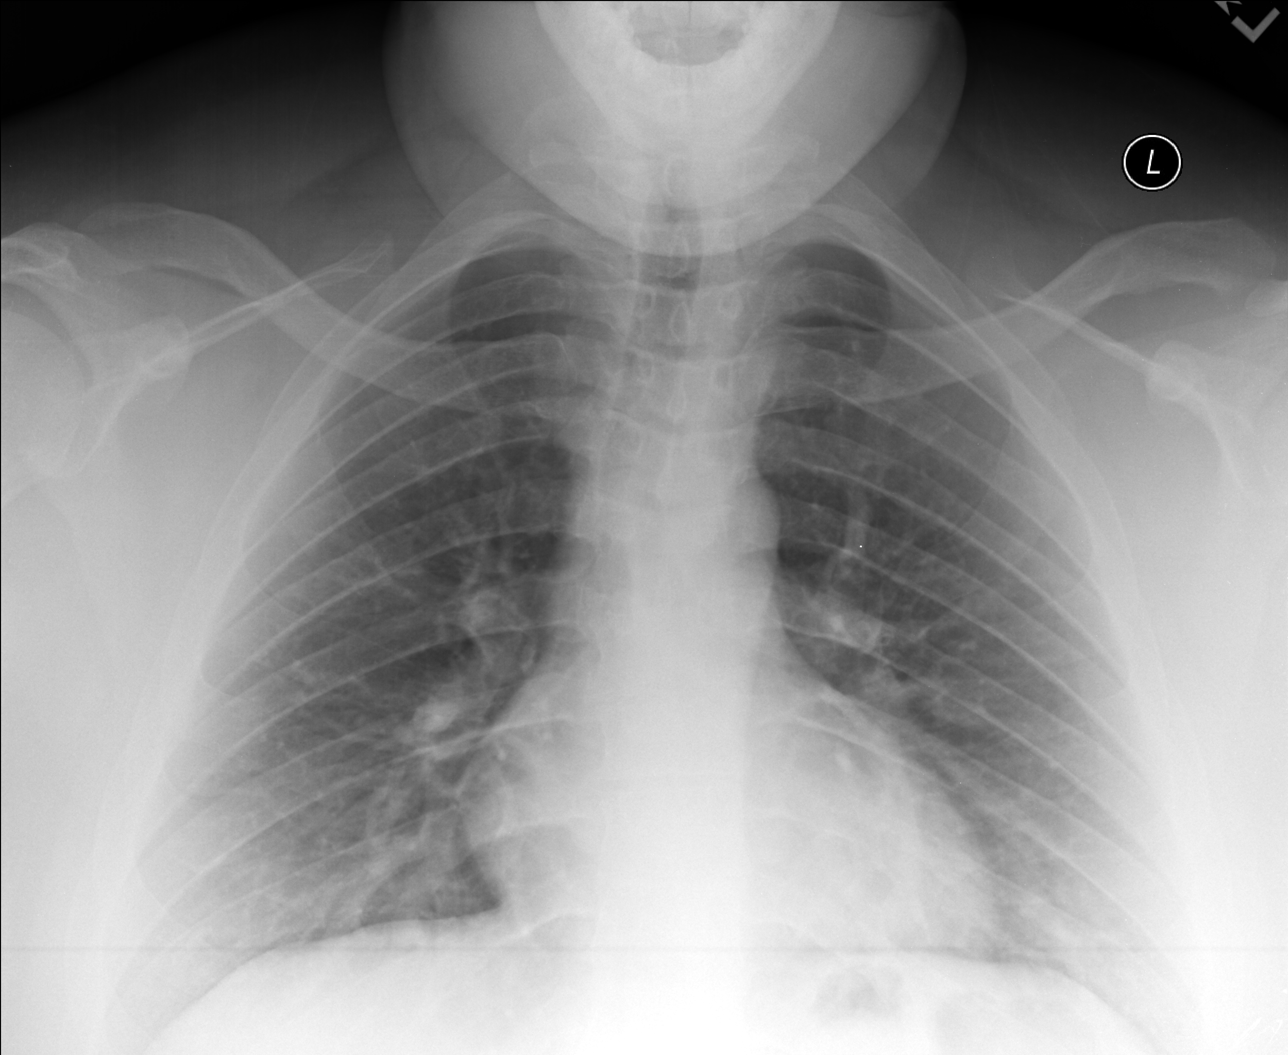

[lateral (1 of 2)]
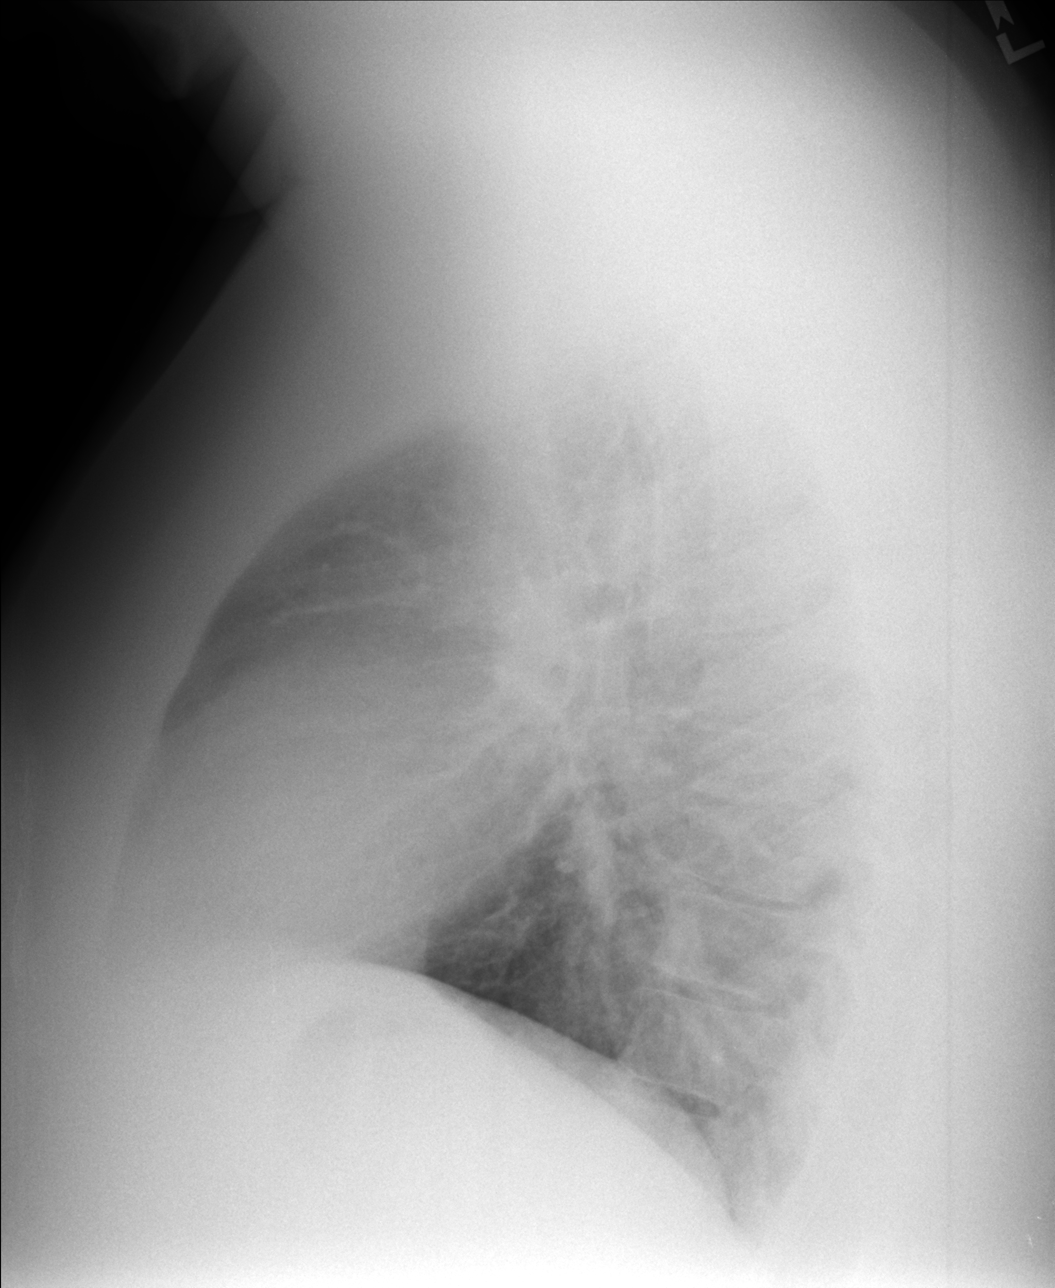

[PA (2 of 2)]
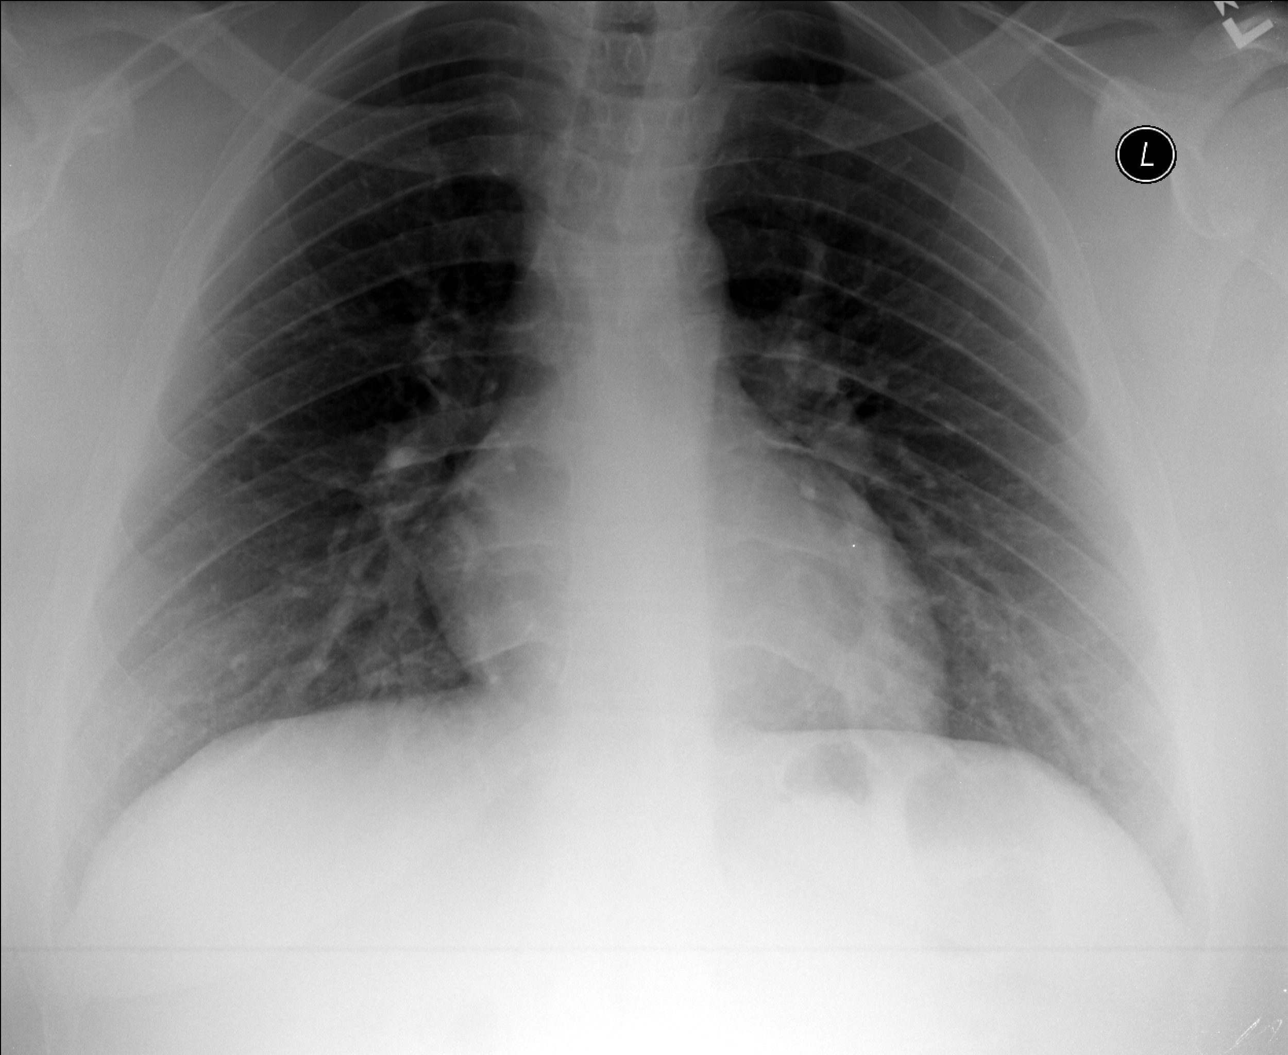

[lateral (2 of 2)]
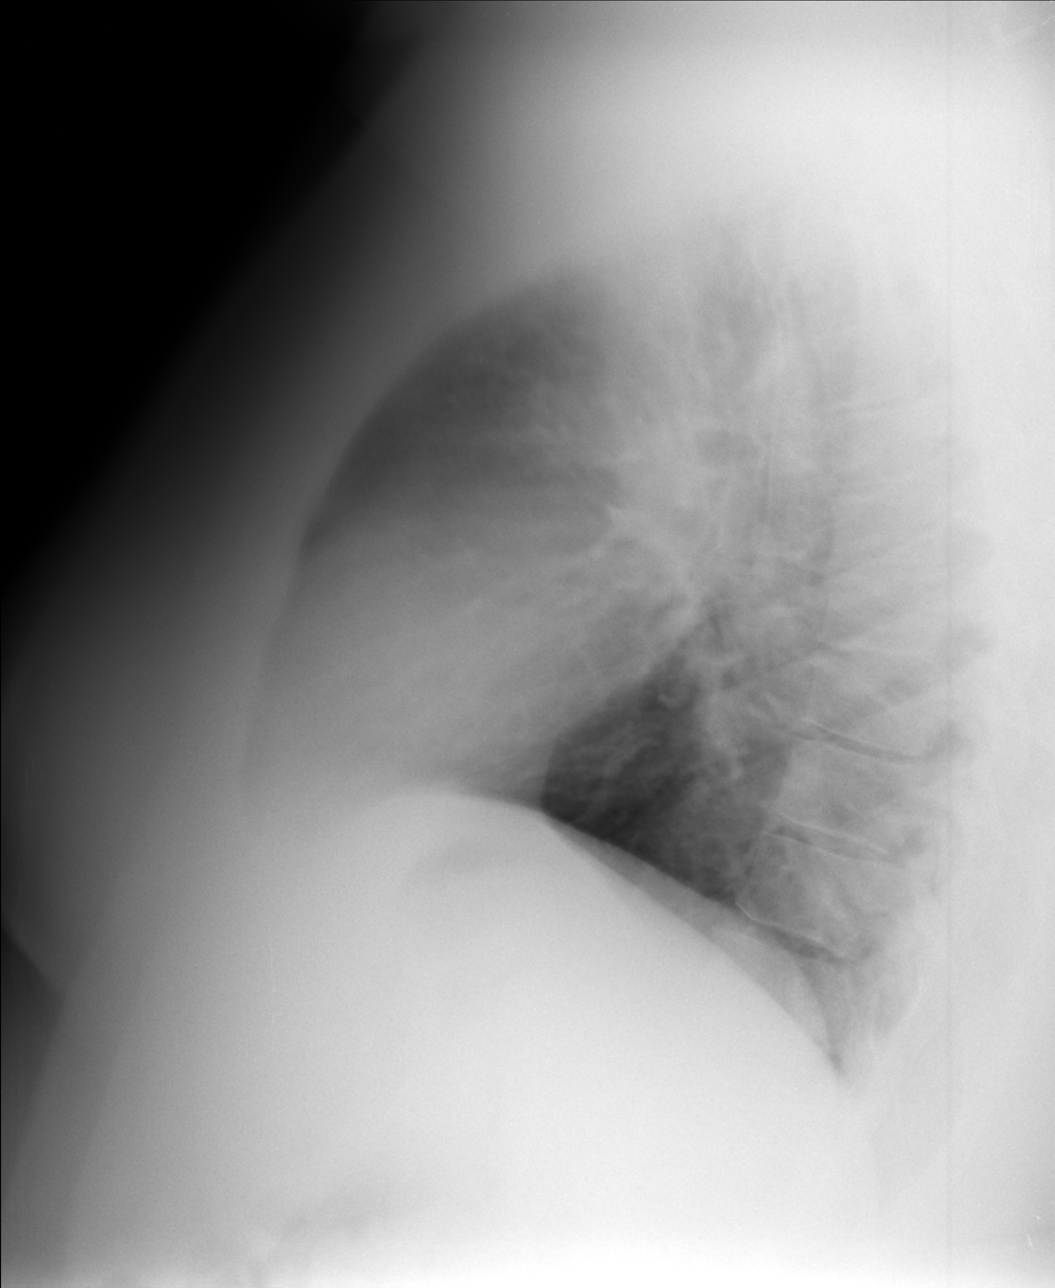

[4 of 4 positions shown; findings below may reference images not displayed]

FINDINGS: Normal heart size and pulmonary vascularity. No focal airspace
disease or consolidation in the lungs. No blunting of costophrenic
angles. No pneumothorax. Mediastinal contours appear intact.
IMPRESSION: No active cardiopulmonary disease.

## 2017-05-20 ENCOUNTER — Other Ambulatory Visit: Payer: Self-pay

## 2017-05-20 ENCOUNTER — Encounter: Payer: Self-pay | Admitting: Family Medicine

## 2017-05-20 ENCOUNTER — Ambulatory Visit: Payer: BLUE CROSS/BLUE SHIELD | Admitting: Family Medicine

## 2017-05-20 VITALS — BP 160/78 | HR 95 | Temp 98.9°F | Ht 79.0 in | Wt 370.0 lb

## 2017-05-20 DIAGNOSIS — Z760 Encounter for issue of repeat prescription: Secondary | ICD-10-CM

## 2017-05-20 DIAGNOSIS — L309 Dermatitis, unspecified: Secondary | ICD-10-CM | POA: Diagnosis not present

## 2017-05-20 DIAGNOSIS — R319 Hematuria, unspecified: Secondary | ICD-10-CM | POA: Diagnosis not present

## 2017-05-20 DIAGNOSIS — R030 Elevated blood-pressure reading, without diagnosis of hypertension: Secondary | ICD-10-CM | POA: Diagnosis not present

## 2017-05-20 LAB — POCT CBC
Granulocyte percent: 52.7 %G (ref 37–80)
HCT, POC: 50.6 % (ref 43.5–53.7)
Hemoglobin: 16.4 g/dL (ref 14.1–18.1)
Lymph, poc: 2.6 (ref 0.6–3.4)
MCH, POC: 28.1 pg (ref 27–31.2)
MCHC: 32.5 g/dL (ref 31.8–35.4)
MCV: 86.6 fL (ref 80–97)
MID (cbc): 0.3 (ref 0–0.9)
MPV: 8.2 fL (ref 0–99.8)
POC Granulocyte: 3.2 (ref 2–6.9)
POC LYMPH PERCENT: 42.8 %L (ref 10–50)
POC MID %: 4.5 %M (ref 0–12)
Platelet Count, POC: 176 10*3/uL (ref 142–424)
RBC: 5.85 M/uL (ref 4.69–6.13)
RDW, POC: 14.8 %
WBC: 6 10*3/uL (ref 4.6–10.2)

## 2017-05-20 LAB — POC MICROSCOPIC URINALYSIS (UMFC): Mucus: ABSENT

## 2017-05-20 LAB — POCT URINALYSIS DIPSTICK
Bilirubin, UA: NEGATIVE
Glucose, UA: NEGATIVE
Ketones, UA: NEGATIVE
Leukocytes, UA: NEGATIVE
Nitrite, UA: NEGATIVE
Protein, UA: NEGATIVE
Spec Grav, UA: 1.025 (ref 1.010–1.025)
Urobilinogen, UA: 0.2 E.U./dL
pH, UA: 7 (ref 5.0–8.0)

## 2017-05-20 MED ORDER — TRIAMCINOLONE ACETONIDE 0.1 % EX CREA: 1.0000 "application " | TOPICAL_CREAM | Freq: Three times a day (TID) | CUTANEOUS | 1 refills | Status: AC

## 2017-05-20 NOTE — Patient Instructions (Signed)
     IF you received an x-ray today, you will receive an invoice from [facility]. Please contact  Radiology at [phone] with questions or concerns regarding your invoice.   IF you received labwork today, you will receive an invoice from [facility]. Please contact [facility] at [phone] with questions or concerns regarding your invoice.   Our billing staff will not be able to assist you with questions regarding bills from these companies.  You will be contacted with the lab results as soon as they are available. The fastest way to get your results is to activate your My Chart account. Instructions are located on the last page of this paperwork. If you have not heard from us regarding the results in 2 weeks, please contact this office.     

## 2017-05-20 NOTE — Progress Notes (Signed)
11/27/201810:01 AM  Charles CousinsMichael Barry 1985-10-10, 31 y.o. male [id number]  Chief Complaint  Patient presents with  . Hematuria    2 days, some pain, took otc aleve. when clearing of the throat feels pressure. says he was lifting heavy bags wen problem arised  . Rash    needs to be looked at    HPI:   Patient is a 31 y.o. male with no significant past medical history who presents today for one time of gross hematuria associated with mild mid abdominal pain, sneezing, nasal congestion, sore throat, and taking naproxen ? Higher dose than recommended? X 1.    He states that he was cleaning out his closets when he started having sneezing, runny nose, coughing and sore throat. He noticed a mild pressure feeling across his middle abdomen whenever he coughed. He took naproxen at work, 2 tabs of ? 500mg ? And later that day noticed bright red urine. Color has been intermittent between clear yellow and pink tinged since then.   He denies any pain with urination, does not smoke, denies any flank pain. States has never been sexually active. Denies any personal h/o kidney stones. His mom does have h/o kidney stones, otherwise no fhx of kidney disease. Denies any rashes, joint pain or swelling.   Has has elevated BP in the past, normally normal with recheck later during the visit.   Patient also requesting refill of triamcinolone which he has been prescribed in the past for eczema on his legs.  [facility] 05/20/2017 06/06/2015  Decreased Interest 0 -  Down, Depressed, Hopeless 0 0  PHQ - 2 Score 0 0    No Known Allergies  Prior to Admission medications   Medication Sig Start Date End Date Taking? Authorizing Provider  Ascorbic Acid (VITAMIN C) 1000 MG tablet Take 1,000 mg by mouth daily.   Yes [provider]  cetirizine (ZYRTEC) 10 MG chewable tablet Chew 10 mg by mouth daily.   Yes [provider]    History reviewed. No pertinent past medical  history.  History reviewed. No pertinent surgical history.  Social History   Tobacco Use  . Smoking status: Former Smoker    Packs/day: 0.00  . Smokeless tobacco: Never Used  Substance Use Topics  . Alcohol use: Yes    Comment: 1-2/week    Family History  Problem Relation Age of Onset  . Diabetes Mother   . Diabetes Father   . Heart disease Father     Review of Systems  Constitutional: Negative for chills and fever.  Respiratory: Negative for cough and shortness of breath.   Cardiovascular: Negative for chest pain, palpitations and leg swelling.  Gastrointestinal: Positive for abdominal pain. Negative for constipation, diarrhea, nausea and vomiting.  Genitourinary: Positive for hematuria. Negative for dysuria, flank pain, frequency and urgency.  Skin: Positive for rash.  Endo/Heme/Allergies: Does not bruise/bleed easily.   Per HPI  OBJECTIVE:  Blood pressure (!) 140/100, pulse 95, temperature 98.9 F (37.2 C), temperature source Oral, height 6\' 7"  (2.007 m), weight (!) 370 lb (167.8 kg), SpO2 98 %.  BP Readings from Last 3 Encounters:  05/20/17 (!) 160/78  06/06/15 130/70  03/28/14 128/70    Physical Exam  Constitutional: He is oriented to person, place, and time and well-developed, well-nourished, and in no distress.  HENT:  Head: Normocephalic and atraumatic.  Mouth/Throat: Oropharynx is clear and moist.  Eyes: EOM are normal. Pupils are equal, round, and reactive to light.  Neck: Neck  supple.  Cardiovascular: Normal rate and regular rhythm. Exam reveals no gallop and no friction rub.  No murmur heard. Pulmonary/Chest: Effort normal and breath sounds normal. He has no wheezes. He has no rales.  Abdominal: Soft. Bowel sounds are normal. There is no hepatosplenomegaly. There is no tenderness. There is no CVA tenderness.  Neurological: He is alert and oriented to person, place, and time. Gait normal.  Skin: Skin is warm and dry. Rash (maculopapular patches on  dorsal aspects of lower extremities) noted.    Results for orders placed or performed in visit on 05/20/17 (from the past 24 hour(s))  POCT urinalysis dipstick     Status: Abnormal   Collection Time: 05/20/17  8:56 AM  Result Value Ref Range   Color, UA BROWN    Clarity, UA CLOUDY    Glucose, UA NEG    Bilirubin, UA NEG    Ketones, UA NEG    Spec Grav, UA 1.025 1.010 - 1.025   Blood, UA LARGE    pH, UA 7.0 5.0 - 8.0   Protein, UA NEG    Urobilinogen, UA 0.2 0.2 or 1.0 E.U./dL   Nitrite, UA NEG    Leukocytes, UA Negative Negative  POCT CBC     Status: Normal   Collection Time: 05/20/17  9:59 AM  Result Value Ref Range   WBC 6.0 4.6 - 10.2 K/uL   Lymph, poc 2.6 0.6 - 3.4   POC LYMPH PERCENT 42.8 10 - 50 %L   MID (cbc) 0.3 0 - 0.9   POC MID % 4.5 0 - 12 %M   POC Granulocyte 3.2 2 - 6.9   Granulocyte percent 52.7 37 - 80 %G   RBC 5.85 4.69 - 6.13 M/uL   Hemoglobin 16.4 14.1 - 18.1 g/dL   HCT, POC 16.150.6 09.643.5 - 53.7 %   MCV 86.6 80 - 97 fL   MCH, POC 28.1 27 - 31.2 pg   MCHC 32.5 31.8 - 35.4 g/dL   RDW, POC 04.514.8 %   Platelet Count, POC 176 142 - 424 K/uL   MPV 8.2 0 - 99.8 fL  POCT Microscopic Urinalysis ([facility])     Status: Abnormal   Collection Time: 05/20/17 10:07 AM  Result Value Ref Range   WBC,UR,HPF,POC None None WBC/hpf   RBC,UR,HPF,POC Too numerous to count  (A) None RBC/hpf   Bacteria Many (A) None, Too numerous to count   Mucus Absent Absent   Epithelial Cells, UR Per Microscopy None None, Too numerous to count cells/hpf    ASSESSMENT and PLAN 1. Hematuria, unspecified type Unclear etiology, starting workup per below. RTC precautions given. - POCT urinalysis dipstick - POCT CBC - POCT Microscopic Urinalysis ([facility]) - Comprehensive metabolic panel - Urine Culture - US RENAL; Future  2. Elevated BP without diagnosis of hypertension Seems white coat per patient's description. However, given fhx and weight, discussed checking BP in outpatient setting, bring BP  log at next visit.   3. Eczema, unspecified type Refilling triamcinolone  4. Medication refill - triamcinolone cream (KENALOG) 0.1 %; Apply 1 application topically 3 (three) times daily.  Return in about 2 weeks (around 06/03/2017).    Myles LippsIrma M Santiago, MD [facility] [address] [place], [place] [address] Ph.  [phone] Fax [phone]

## 2017-05-21 LAB — COMPREHENSIVE METABOLIC PANEL
ALT: 38 IU/L (ref 0–44)
AST: 30 IU/L (ref 0–40)
Albumin/Globulin Ratio: 1.4 (ref 1.2–2.2)
Albumin: 4.4 g/dL (ref 3.5–5.5)
Alkaline Phosphatase: 101 IU/L (ref 39–117)
BUN/Creatinine Ratio: 9 (ref 9–20)
BUN: 8 mg/dL (ref 6–20)
Bilirubin Total: 0.4 mg/dL (ref 0.0–1.2)
CO2: 26 mmol/L (ref 20–29)
Calcium: 9.7 mg/dL (ref 8.7–10.2)
Chloride: 102 mmol/L (ref 96–106)
Creatinine, Ser: 0.91 mg/dL (ref 0.76–1.27)
GFR calc Af Amer: 130 mL/min/{1.73_m2} (ref >59)
GFR calc non Af Amer: 113 mL/min/{1.73_m2} (ref >59)
Globulin, Total: 3.2 g/dL (ref 1.5–4.5)
Glucose: 97 mg/dL (ref 65–99)
Potassium: 4.7 mmol/L (ref 3.5–5.2)
Sodium: 141 mmol/L (ref 134–144)
Total Protein: 7.6 g/dL (ref 6.0–8.5)

## 2017-05-22 LAB — URINE CULTURE

## 2019-01-13 ENCOUNTER — Other Ambulatory Visit: Payer: Self-pay

## 2019-01-13 DIAGNOSIS — Z20822 Contact with and (suspected) exposure to covid-19: Secondary | ICD-10-CM

## 2019-01-17 LAB — NOVEL CORONAVIRUS, NAA: SARS-CoV-2, NAA: NOT DETECTED

## 2019-06-28 ENCOUNTER — Ambulatory Visit: Payer: Managed Care, Other (non HMO) | Attending: Internal Medicine

## 2019-06-28 DIAGNOSIS — Z20822 Contact with and (suspected) exposure to covid-19: Secondary | ICD-10-CM

## 2019-06-29 LAB — NOVEL CORONAVIRUS, NAA: SARS-CoV-2, NAA: NOT DETECTED

## 2019-07-08 ENCOUNTER — Other Ambulatory Visit: Payer: Self-pay | Admitting: Cardiology

## 2019-07-08 DIAGNOSIS — Z20822 Contact with and (suspected) exposure to covid-19: Secondary | ICD-10-CM

## 2019-07-09 LAB — NOVEL CORONAVIRUS, NAA: SARS-CoV-2, NAA: NOT DETECTED

## 2019-07-19 ENCOUNTER — Other Ambulatory Visit: Payer: Self-pay | Admitting: Cardiology

## 2019-07-19 DIAGNOSIS — Z20822 Contact with and (suspected) exposure to covid-19: Secondary | ICD-10-CM

## 2019-07-20 LAB — NOVEL CORONAVIRUS, NAA: SARS-CoV-2, NAA: NOT DETECTED

## 2019-08-02 ENCOUNTER — Other Ambulatory Visit: Payer: Self-pay | Admitting: *Deleted

## 2019-08-02 DIAGNOSIS — Z20822 Contact with and (suspected) exposure to covid-19: Secondary | ICD-10-CM

## 2019-08-03 LAB — NOVEL CORONAVIRUS, NAA: SARS-CoV-2, NAA: NOT DETECTED

## 2019-08-16 ENCOUNTER — Other Ambulatory Visit: Payer: Self-pay | Admitting: Cardiology

## 2019-08-16 DIAGNOSIS — Z20822 Contact with and (suspected) exposure to covid-19: Secondary | ICD-10-CM

## 2019-08-17 LAB — NOVEL CORONAVIRUS, NAA: SARS-CoV-2, NAA: NOT DETECTED

## 2019-08-30 ENCOUNTER — Other Ambulatory Visit: Payer: Self-pay | Admitting: Cardiology

## 2019-08-30 DIAGNOSIS — Z20822 Contact with and (suspected) exposure to covid-19: Secondary | ICD-10-CM

## 2019-08-31 LAB — NOVEL CORONAVIRUS, NAA: SARS-CoV-2, NAA: NOT DETECTED

## 2019-09-13 ENCOUNTER — Other Ambulatory Visit: Payer: Self-pay

## 2019-09-13 DIAGNOSIS — Z20822 Contact with and (suspected) exposure to covid-19: Secondary | ICD-10-CM

## 2019-09-14 LAB — SARS-COV-2, NAA 2 DAY TAT

## 2019-09-14 LAB — NOVEL CORONAVIRUS, NAA: SARS-CoV-2, NAA: NOT DETECTED

## 2023-03-06 NOTE — ED Provider Notes (Signed)
 [facility] [place]  [facility] EMERGENCY DEPARTMENT  Time of Arrival:   03/06/23 2003    Final diagnoses:  [N20.1] Calculus of proximal left ureter (Primary)  [N13.30] Hydronephrosis of left kidney  [R11.2] Nausea and vomiting, unspecified vomiting type    Medical Decision Making:    Differential/Questionable Diagnosis:   Intra-abdominal abscess, perforation, obstruction, appendicitis, cholecystitis, pancreatitis, colitis, diverticulitis, pyelonephritis, urinary tract infection, nephrolithiasis, transaminitis, esophagitis, gastritis, gastric/duodenal ulcer, incarcerated/strangulated hernia, dehydration, food poisoning, electrolyte abnormalities, anemia                             Supplemental Historians Include; : patient and medical records   ED Course: The patient is a 37 year old male who presents to the ED with left flank pain, nausea, vomiting, onset after eating a [employer] burrito.  Patient is originally from [place]  and is in the area visiting.  Denies any history of similar complaints.  Initial vitals reviewed.  Blood pressure 153/96.  Vitals otherwise unremarkable.  Patient is uncomfortable appearing on exam with exam as documented.  Given symptoms, will obtain further workup.  IV access obtained.  Labs drawn.  Patient treated with NS 1 L IV fluid bolus, Zofran 4 mg IV, Pepcid 20 mg IV, and Toradol 30 mg IV.  Will hold off on obtaining CT imaging at this time until lab work results given his otherwise reassuring examination and he is afebrile.  Diagnostics reviewed.  Urinalysis showing small urine blood.  Lab work is otherwise unremarkable.  Given hematuria noted on urinalysis, will obtain CT imaging to further evaluate.  Remaining diagnostics reviewed.  CT is showing an obstructing 4 to 5 mm stone in the proximal left ureter causing mild hydronephrosis, delayed enhancement of the left kidney, and asymmetric perinephric stranding.  Imaging was  otherwise unremarkable for acute pathology.  On reassessment, patient reports significant improvement to his symptoms following medication administration.  He is resting comfortably and denies any further complaints at this time.  Diagnostic result discussed.  Patient advised symptoms are due to a left proximal ureteral calculus causing mild left-sided hydronephrosis.  Low suspicion for acute emergent pathology at this time given otherwise reassuring examination including unremarkable renal function studies and urinalysis is not indicative of infection.  As patient is nontoxic-appearing with improving symptoms and workup is otherwise reassuring, do not feel that additional workup or observation is indicated at this time.  After discussion, patient is agreeable to be discharged home to trial outpatient management and following up with urology for further evaluation and management.  Patient to be treated with Zofran 4 mg IV, Percocet 1 tablet p.o., and Flomax 0.4 mg p.o. prior to discharge.  Patient is stable for discharge home.  Prescribed ibuprofen, Tylenol, Zofran, Flomax, and oxycodone and instructed on use.  Advised to continue oral hydration.  Instructed to follow-up with urology for further evaluation.  Return precautions given.             Documentation/Prior Results Review : Old medical records:   , Nursing notes, Previous radiology studies, Care Everywhere record:   , Other Electronic MEDICAL RECORD NUMBER    On chart review, patient has no previous imaging of his abdomen or pelvis noted.  Patient was last evaluated by Dr. Melonie Colonel from primary care output noma at [facility], [place] on 05/20/2017 for hematuria, eczema, elevated blood pressure, and medication refill.  Patient has had no additional outpatient or ED visits since then.  Rhythm interpretations from  Monitor by me; : N/A  Image interpretations by me -  : CT is showing a left proximal ureteral calculus with no evidence of perforation  or intestinal obstruction as read by me.  CT ABD/PELVIS-IV ONLY  Final Result    1. Obstructing 4-5 mm stone in the proximal left ureter produces mild hydronephrosis, delayed enhancement of the left kidney and asymmetric perinephric stranding. Correlate for superimposed urinary tract infection.  2. Normal appendix and additional findings as detailed in the body of the report.    Signed By: Curtistine SHAUNNA Heap, MD on 03/06/2023 11:24 PM        .   Social determinants : no primary care provider   Discussion of Management with other Physicians, QHP or Appropriate Source; : None  .  Disposition:  Home  Discharge Medication List as of 03/07/2023 12:07 AM     START taking these medications   Details  acetaminophen (TYLENOL EXTRA STRENGTH) 500 mg PO PwPk Take 1,000 mg by Mouth Every 6 Hours As Needed., Disp-40 Packet, R-0, Print    ibuprofen (ADVIL;MOTRIN) 600 mg PO TABS Take 1 Tab by Mouth Every 6 Hours As Needed., Disp-90 Tab, R-0, Print    ondansetron (ZOFRAN) 4 mg PO ODT. Take 1 Tab by Mouth Every 6 Hours As Needed for Nausea (PRN FOR NAUSEA AND VOMITING). ALLOW TABLET TO DISSOLVE ON TONGUE., Disp-30 Tab, R-0, Print    oxyCODONE (ROXICODONE) 5 mg PO TABS Take 1 Tab by Mouth Every 6 Hours As Needed., Disp-16 Tab, R-0, Print    tamsulosin (FLOMAX) 0.4 mg PO CAPS Take 1 Cap by Mouth Once a Day., Disp-14 Cap, R-0, Print       Chief Complaint  Patient presents with  . ABDOMINAL PAIN    The patient is a 37 year old male who presents to the ED with left flank pain, nausea, and vomiting.  Patient reports he ate a chicken burrito from [facility] and afterwards developed sudden onset nausea, vomiting, and left flank pain.  Symptoms persisted and EMS was called.  Patient was transported to the ED for further evaluation.  On arrival, complaining of ongoing symptoms.  He denies any history of similar complaints.  He denies any known sick contacts.  Patient reports he is originally from [place] and is in the area visiting.          Review of Systems: Constitutional:  Negative for fever.  HENT:  Negative for congestion.   Respiratory:  Negative for cough and shortness of breath.   Cardiovascular:  Negative for chest pain and palpitations.  Gastrointestinal:  Positive for nausea and vomiting. Negative for abdominal pain.  Genitourinary:  Positive for flank pain. Negative for dysuria, urgency, frequency and hematuria.  Neurological:  Negative for dizziness, light-headedness and headaches.    Physical Exam Vitals and nursing note reviewed.  Constitutional:      Comments: Uncomfortable appearing.  HENT:     Head: Normocephalic and atraumatic.  Eyes:     Extraocular Movements: Extraocular movements intact.  Cardiovascular:     Rate and Rhythm: Normal rate and regular rhythm.     Pulses: Normal pulses.  Pulmonary:     Effort: Pulmonary effort is normal. No respiratory distress.     Breath sounds: No wheezing, rhonchi or rales.  Abdominal:     Palpations: Abdomen is soft.     Tenderness: There is no abdominal tenderness. There is no right CVA tenderness, left CVA tenderness, guarding or rebound.  Musculoskeletal:  General: Normal range of motion.     Cervical back: Normal range of motion.  Skin:    General: Skin is warm and dry.  Neurological:     General: No focal deficit present.     Mental Status: He is alert and oriented to person, place, and time. Mental status is at baseline.     No past medical history on file. No past surgical history on file. No family history on file. Social History   Occupational History  . Not on file  Tobacco Use  . Smoking status: Not on file  . Smokeless tobacco: Not on file  Substance and Sexual Activity  . Alcohol use: Not on file  . Drug use: Not on file  . Sexual activity: Not on file   Outpatient Medications Marked as Taking for the 03/06/23 encounter [facility] Encounter)  Medication Sig Dispense Refill   . acetaminophen (TYLENOL EXTRA STRENGTH) 500 mg PO PwPk Take 1,000 mg by Mouth Every 6 Hours As Needed. 40 Packet 0  . ibuprofen (ADVIL;MOTRIN) 600 mg PO TABS Take 1 Tab by Mouth Every 6 Hours As Needed. 90 Tab 0  . ondansetron (ZOFRAN) 4 mg PO ODT. Take 1 Tab by Mouth Every 6 Hours As Needed for Nausea (PRN FOR NAUSEA AND VOMITING). ALLOW TABLET TO DISSOLVE ON TONGUE. 30 Tab 0  . oxyCODONE (ROXICODONE) 5 mg PO TABS Take 1 Tab by Mouth Every 6 Hours As Needed. 16 Tab 0  . tamsulosin (FLOMAX) 0.4 mg PO CAPS Take 1 Cap by Mouth Once a Day. 14 Cap 0   No Known Allergies  Vital Signs: Patient Vitals for the past 72 hrs:  Temp Heart Rate Resp BP BP Mean SpO2 Weight  03/06/23 2048 -- -- -- -- -- -- (!) 158.8 kg (350 lb)  03/06/23 2006 98.6 F (37 C) 74 16 153/96 (!) 115 MM HG 97 % --    Diagnostics: Labs:   Results for orders placed or performed during the hospital encounter of 03/06/23  URINALYSIS POC (LAB)  Result Value Ref Range   Urine pH 6.0 5.0 - 8.0 pH   Urine Protein Screen Trace (A) Negative mg/dL   Urine Glucose Negative Negative mg/dL   Urine Ketones Negative Negative mg/dL   Urine Occult Blood Small (A) Negative   Urine Specific Gravity >=1.030 1.003 - 1.030   Urine Nitrite Negative Negative   Urine Leukocyte Esterase Negative Negative   Urine Bilirubin Negative Negative   Urine Urobilinogen 0.2 0.2 - 1.0 mg/dL  BASIC METABOLIC PANEL  Result Value Ref Range   Potassium 4.1 3.5 - 5.5 mmol/L   Sodium 139 133 - 145 mmol/L   Chloride 100 98 - 110 mmol/L   Glucose 114 (H) 70 - 99 mg/dL   Calcium 9.8 8.4 - 89.4 mg/dL   BUN 11 6 - 22 mg/dL   Creatinine 1.1 0.5 - 1.2 mg/dL   CO2 27 20 - 32 mmol/L   eGFR >60.0 >60.0 mL/min/1.73 sq.m.   Anion Gap 12.0 3.0 - 15.0 mmol/L  HEPATIC FUNCTION PANEL  Result Value Ref Range   Albumin 4.3 3.5 - 5.0 g/dL   Total Protein 8.1 6.4 - 8.3 g/dL   Globulin 3.8 2.0 - 4.0 g/dL   A/G Ratio 1.1 1.1 - 2.6 ratio   Bilirubin Total 0.3 0.2 -  1.2 mg/dL   Bilirubin Direct <9.7 0.0 - 0.3 mg/dL   SGOT (AST) 24 10 - 37 U/L   Alkaline Phosphatase 92 25 - 115  U/L   SGPT (ALT) 22 5 - 40 U/L  LIPASE  Result Value Ref Range   Lipase 28 7 - 60 U/L  MAGNESIUM SERUM  Result Value Ref Range   Magnesium 1.9 1.6 - 2.5 mg/dL  CBC WITH DIFFERENTIAL AUTO  Result Value Ref Range   WBC 6.6 4.0 - 11.0 K/uL   RBC 5.42 3.80 - 5.80 M/uL   HGB 15.2 13.2 - 17.3 g/dL   HCT 52.1 63.3 - 48.0 %   MCV 88 80 - 95 fL   MCH 28 26 - 34 pg   MCHC 32 31 - 36 g/dL   RDW 86.3 89.9 - 84.4 %   Platelet 239 140 - 440 K/uL   MPV 9.7 9.0 - 13.0 fL   Segmented Neutrophils (Auto) 49 40 - 75 %   Lymphocytes (Auto) 39 20 - 45 %   Monocytes (Auto) 8 3 - 12 %   Eosinophils (Auto) 4 0 - 6 %   Basophils (Auto) 1 0 - 2 %   Absolute Neutrophils (Auto) 3.2 1.8 - 7.7 K/uL   Absolute Lymphocytes (Auto) 2.5 1.0 - 4.8 K/uL   Absolute Monocytes (Auto) 0.5 0.1 - 1.0 K/uL   Absolute Eosinophils (Auto) 0.3 0.0 - 0.5 K/uL   Absolute Basophils (Auto) 0.0 0.0 - 0.2 K/uL   ECG: No results found for this visit on 03/06/23.     Medications ordered/given in the ED Medications  sodium chloride (normal saline) 0.9% infusion (0 mL Intravenous stopped 03/07/23 0020)  ketorolac (ToradoL) injection 30 mg (30 mg Intravenous Given 03/06/23 2055)  ondansetron (PF) (Zofran) injection 4 mg (4 mg IV Push Given 03/06/23 2054)  famotidine (PF) (Pepcid) injection 20 mg (20 mg IV Push Given 03/06/23 2054)  iohexol  (OmniPaque ) 350 mg iodine/mL solution 100 mL (100 mL Intravenous Given 03/06/23 2236)  tamsulosin (Flomax) capsule 0.4 mg (0.4 mg Oral Given 03/07/23 0016)  ondansetron (PF) (Zofran) injection 4 mg (4 mg IV Push Given 03/07/23 0016)  oxyCODONE-acetaminophen (Percocet) 5-325 mg tablet 1 Tab (1 Tab Oral Given 03/07/23 0016)     Note: This was dictated using a computer transcription program. Although proofread, it may contain computer transcription errors and phonetic errors. Other human  proofreading errors may also exist. Corrections may be performed at a later time. Please contact for any clarification if needed.

## 2024-03-05 ENCOUNTER — Encounter (HOSPITAL_COMMUNITY): Payer: Self-pay

## 2024-03-05 ENCOUNTER — Emergency Department (HOSPITAL_COMMUNITY)
Admission: EM | Admit: 2024-03-05 | Discharge: 2024-03-06 | Disposition: A | Discharge status: Home / Self Care | Payer: Self-pay | Attending: Emergency Medicine | Admitting: Emergency Medicine

## 2024-03-05 ENCOUNTER — Emergency Department (HOSPITAL_COMMUNITY): Payer: Self-pay

## 2024-03-05 DIAGNOSIS — Z79899 Other long term (current) drug therapy: Secondary | ICD-10-CM | POA: Insufficient documentation

## 2024-03-05 DIAGNOSIS — I10 Essential (primary) hypertension: Secondary | ICD-10-CM | POA: Insufficient documentation

## 2024-03-05 DIAGNOSIS — R030 Elevated blood-pressure reading, without diagnosis of hypertension: Secondary | ICD-10-CM

## 2024-03-05 DIAGNOSIS — R0789 Other chest pain: Secondary | ICD-10-CM | POA: Insufficient documentation

## 2024-03-05 DIAGNOSIS — M549 Dorsalgia, unspecified: Secondary | ICD-10-CM | POA: Insufficient documentation

## 2024-03-05 HISTORY — DX: Calculus of kidney: N20.0

## 2024-03-05 LAB — CBC WITH DIFFERENTIAL/PLATELET
Abs Immature Granulocytes: 0.01 K/uL (ref 0.00–0.07)
Basophils Absolute: 0 K/uL (ref 0.0–0.1)
Basophils Relative: 0 %
Eosinophils Absolute: 0.1 K/uL (ref 0.0–0.5)
Eosinophils Relative: 2 %
HCT: 48 % (ref 39.0–52.0)
Hemoglobin: 14.8 g/dL (ref 13.0–17.0)
Immature Granulocytes: 0 %
Lymphocytes Relative: 34 %
Lymphs Abs: 2.3 K/uL (ref 0.7–4.0)
MCH: 27.5 pg (ref 26.0–34.0)
MCHC: 30.8 g/dL (ref 30.0–36.0)
MCV: 89.1 fL (ref 80.0–100.0)
Monocytes Absolute: 0.6 K/uL (ref 0.1–1.0)
Monocytes Relative: 9 %
Neutro Abs: 3.7 K/uL (ref 1.7–7.7)
Neutrophils Relative %: 55 %
Platelets: 211 K/uL (ref 150–400)
RBC: 5.39 MIL/uL (ref 4.22–5.81)
RDW: 14 % (ref 11.5–15.5)
WBC: 6.7 K/uL (ref 4.0–10.5)
nRBC: 0 % (ref 0.0–0.2)

## 2024-03-05 LAB — URINALYSIS, ROUTINE W REFLEX MICROSCOPIC
Bilirubin Urine: NEGATIVE
Glucose, UA: NEGATIVE mg/dL
Hgb urine dipstick: NEGATIVE
Ketones, ur: NEGATIVE mg/dL
Leukocytes,Ua: NEGATIVE
Nitrite: NEGATIVE
Protein, ur: NEGATIVE mg/dL
Specific Gravity, Urine: 1.003 — ABNORMAL LOW (ref 1.005–1.030)
pH: 7 (ref 5.0–8.0)

## 2024-03-05 NOTE — ED Triage Notes (Signed)
 Pt states that he has been feeling weird for a while and wants to get checked, had been having back pain and last time he went to the dentist they told him he had high BP. Pt has no actual complaint

## 2024-03-05 NOTE — ED Provider Notes (Signed)
 [place] EMERGENCY DEPARTMENT AT [facility] Provider Note   CSN: [id number] Arrival date & time: 03/05/24  2152     Patient presents with: Back Pain   Charles Barry is a 37 y.o. male.  {Add pertinent medical, surgical, social history, OB history to YEP:67052} Patient denies medical history.  Does have a history of kidney stones however.  States he has been feeling indigestion for 2 weeks in the center of his chest that comes and goes lasting for several minutes to hours at a time.  Denies chest pain.  Feels burning and discomfort in the center of his chest without radiation.  He is concerned because he was told by his dentist he had elevated blood pressure but does not take any prescriptions.  He denies chest pain, abdominal pain, nausea, vomiting, cough or fever.  No radiation of the pain.  Denies any headache, dizziness, lightheadedness.  No abdominal pain, nausea or vomiting.  No pain with urination or blood in the urine.  States he had some upper back pain yesterday for several hours which is since resolved after Tylenol.  Has no symptoms currently.  He is concerned because he has not seen a doctor in a while does not have a PCP.  No focal weakness to his arms or legs.  No facial droop.  No headache.  No room spinning dizziness or lightheadedness.  No chest pain or back pain currently.  No abdominal pain, nausea or vomiting.  Reports a history of kidney stones but denies any back pain or flank pain currently.  The history is provided by the patient.  Back Pain Associated symptoms: no abdominal pain, no dysuria, no fever, no headaches and no weakness        Prior to Admission medications   Medication Sig Start Date End Date Taking? Authorizing Provider  Ascorbic Acid (VITAMIN C) 1000 MG tablet Take 1,000 mg by mouth daily.    [provider]  cetirizine (ZYRTEC) 10 MG chewable tablet Chew 10 mg by mouth daily.    [provider]  triamcinolone   cream (KENALOG ) 0.1 % Apply 1 application topically 3 (three) times daily. 05/20/17   Melonie Tori Mikel CHRISTELLA, MD    Allergies: Patient has no known allergies.    Review of Systems  Constitutional:  Negative for activity change, appetite change and fever.  HENT:  Negative for congestion and rhinorrhea.   Respiratory:  Negative for cough, chest tightness and shortness of breath.   Gastrointestinal:  Negative for abdominal pain, nausea and vomiting.  Genitourinary:  Negative for dysuria and hematuria.  Musculoskeletal:  Positive for back pain.  Skin:  Negative for rash.  Neurological:  Negative for dizziness, weakness and headaches.   all other systems are negative except as noted in the HPI and PMH.    Updated Vital Signs BP (!) 150/113   Pulse 97   Temp 97.8 F (36.6 C) (Oral)   Resp 17   SpO2 100%   Physical Exam Vitals and nursing note reviewed.  Constitutional:      General: He is not in acute distress.    Appearance: He is well-developed.  HENT:     Head: Normocephalic and atraumatic.     Mouth/Throat:     Pharynx: No oropharyngeal exudate.  Eyes:     Conjunctiva/sclera: Conjunctivae normal.     Pupils: Pupils are equal, round, and reactive to light.  Neck:     Comments: No meningismus. Cardiovascular:     Rate  and Rhythm: Normal rate and regular rhythm.     Heart sounds: Normal heart sounds. No murmur heard. Pulmonary:     Effort: Pulmonary effort is normal. No respiratory distress.     Breath sounds: Normal breath sounds.  Abdominal:     Palpations: Abdomen is soft.     Tenderness: There is no abdominal tenderness. There is no guarding or rebound.  Musculoskeletal:        General: No tenderness. Normal range of motion.     Cervical back: Normal range of motion and neck supple.     Comments: 5/5 strength in bilateral lower extremities. Ankle plantar and dorsiflexion intact. Great toe extension intact bilaterally. +2 DP and PT pulses. Normal gait.   Skin:     General: Skin is warm.  Neurological:     Mental Status: He is alert and oriented to person, place, and time.     Cranial Nerves: No cranial nerve deficit.     Motor: No abnormal muscle tone.     Coordination: Coordination normal.     Comments:  5/5 strength throughout. CN 2-12 intact.Equal grip strength.   Psychiatric:        Behavior: Behavior normal.     (all labs ordered are listed, but only abnormal results are displayed) Labs Reviewed  CBC WITH DIFFERENTIAL/PLATELET  COMPREHENSIVE METABOLIC PANEL WITH GFR  URINALYSIS, ROUTINE W REFLEX MICROSCOPIC  D-DIMER, QUANTITATIVE  TROPONIN T, HIGH SENSITIVITY    EKG: None  Radiology: No results found.  {Document cardiac monitor, telemetry assessment procedure when appropriate:32947} Procedures   Medications Ordered in the ED - No data to display    {Click here for ABCD2, HEART and other calculators REFRESH Note before signing:1}                              Medical Decision Making Amount and/or Complexity of Data Reviewed Labs: ordered. Decision-making details documented in ED Course. Radiology: ordered and independent interpretation performed. Decision-making details documented in ED Course. ECG/medicine tests: ordered and independent interpretation performed. Decision-making details documented in ED Course.   Episode of upper back pain yesterday which has since resolved.  No chest pain or shortness of breath but has had intermittent indigestion for several weeks.  No chest pain or back pain currently.  Hypertensive on arrival.  EKG is sinus rhythm with T wave inversions inferiorly. No comparison.  {Document critical care time when appropriate  Document review of labs and clinical decision tools ie CHADS2VASC2, etc  Document your independent review of radiology images and any outside records  Document your discussion with family members, caretakers and with consultants  Document social determinants of health affecting  pt's care  Document your decision making why or why not admission, treatments were needed:32947:::1}   Final diagnoses:  None    ED Discharge Orders     None

## 2024-03-06 ENCOUNTER — Emergency Department (HOSPITAL_COMMUNITY): Payer: Self-pay

## 2024-03-06 LAB — TROPONIN T, HIGH SENSITIVITY
Troponin T High Sensitivity: <15 ng/L (ref 0–19)
Troponin T High Sensitivity: <15 ng/L (ref 0–19)

## 2024-03-06 LAB — COMPREHENSIVE METABOLIC PANEL WITH GFR
ALT: 22 U/L (ref 0–44)
AST: 26 U/L (ref 15–41)
Albumin: 4.5 g/dL (ref 3.5–5.0)
Alkaline Phosphatase: 87 U/L (ref 38–126)
Anion gap: 12 (ref 5–15)
BUN: 10 mg/dL (ref 6–20)
CO2: 23 mmol/L (ref 22–32)
Calcium: 9.6 mg/dL (ref 8.9–10.3)
Chloride: 104 mmol/L (ref 98–111)
Creatinine, Ser: 0.98 mg/dL (ref 0.61–1.24)
GFR, Estimated: >60 mL/min (ref >60)
Glucose, Bld: 93 mg/dL (ref 70–99)
Potassium: 4 mmol/L (ref 3.5–5.1)
Sodium: 139 mmol/L (ref 135–145)
Total Bilirubin: 0.5 mg/dL (ref 0.0–1.2)
Total Protein: 7.7 g/dL (ref 6.5–8.1)

## 2024-03-06 LAB — D-DIMER, QUANTITATIVE: D-Dimer, Quant: <0.27 ug{FEU}/mL (ref 0.00–0.50)

## 2024-03-06 MED ORDER — OMEPRAZOLE 20 MG PO CPDR: 20.0000 mg | DELAYED_RELEASE_CAPSULE | Freq: Every day | ORAL | 0 refills | Status: DC

## 2024-03-06 MED ORDER — IOHEXOL 350 MG/ML SOLN
100.0000 mL | Freq: Once | INTRAVENOUS | Status: AC | PRN
  Administered 2024-03-06: 100 mL via INTRAVENOUS

## 2024-03-06 MED ORDER — AMLODIPINE BESYLATE 5 MG PO TABS: 5.0000 mg | ORAL_TABLET | Freq: Every day | ORAL | 0 refills | Status: DC

## 2024-03-06 NOTE — ED Notes (Signed)
 Patient ambulated to restroom without difficulty.

## 2024-03-06 NOTE — Discharge Instructions (Signed)
 Your testing is reassuring.  No evidence of heart attack or blood clot in the lung.  Take the stomach medication as prescribed.  Avoid alcohol, caffeine, NSAID medications, spicy foods.  Follow-up with your primary doctor as well as cardiologist for further evaluation of her elevated blood pressure.  Return to the ED with exertional chest pain, pain associate shortness of breath, nausea, vomiting, sweating or other concerns.

## 2024-06-11 ENCOUNTER — Other Ambulatory Visit (HOSPITAL_COMMUNITY): Payer: Self-pay

## 2024-06-20 ENCOUNTER — Ambulatory Visit
Admission: EM | Admit: 2024-06-20 | Discharge: 2024-06-20 | Disposition: A | Discharge status: Home / Self Care | Payer: Self-pay

## 2024-06-20 ENCOUNTER — Encounter: Payer: Self-pay | Admitting: Emergency Medicine

## 2024-06-20 DIAGNOSIS — K219 Gastro-esophageal reflux disease without esophagitis: Secondary | ICD-10-CM

## 2024-06-20 DIAGNOSIS — I10 Essential (primary) hypertension: Secondary | ICD-10-CM

## 2024-06-20 HISTORY — DX: Essential (primary) hypertension: I10

## 2024-06-20 MED ORDER — OMEPRAZOLE 20 MG PO CPDR: 20.0000 mg | DELAYED_RELEASE_CAPSULE | Freq: Every day | ORAL | 1 refills | Status: AC

## 2024-06-20 MED ORDER — AMLODIPINE BESYLATE 5 MG PO TABS: 5.0000 mg | ORAL_TABLET | Freq: Every day | ORAL | 1 refills | Status: AC

## 2024-06-20 NOTE — ED Triage Notes (Signed)
 Pt st's he just needs to get a refill on his Amlodipine  and Omeprazole  DR

## 2024-06-20 NOTE — ED Provider Notes (Signed)
 " [facility]    CSN: [id number] Arrival date & time: 06/20/24  1341      History   Chief Complaint Chief Complaint  Patient presents with   Med refill    HPI Charles Barry is a 38 y.o. male.   Pt presents today for refill on Amlodipine  and Omeprazole . Pt has an appt with PCP on 2/17. Pt denies HA, blurred vision, nausea, chest pain, upper back pain, or shortness of breath.   The history is provided by the patient.    Past Medical History:  Diagnosis Date   Kidney stones     There are no active problems to display for this patient.   No past surgical history on file.     Home Medications    Prior to Admission medications  Medication Sig Start Date End Date Taking? Authorizing Provider  amLODipine  (NORVASC ) 5 MG tablet Take 1 tablet (5 mg total) by mouth daily. 03/06/24   Rancour, Garnette, MD  Ascorbic Acid (VITAMIN C) 1000 MG tablet Take 1,000 mg by mouth daily.    [provider]  cetirizine (ZYRTEC) 10 MG chewable tablet Chew 10 mg by mouth daily.    [provider]  omeprazole  (PRILOSEC) 20 MG capsule Take 1 capsule (20 mg total) by mouth daily. 03/06/24   Rancour, Garnette, MD  triamcinolone  cream (KENALOG ) 0.1 % Apply 1 application topically 3 (three) times daily. 05/20/17   Melonie Tori Mikel CHRISTELLA, MD    Family History Family History  Problem Relation Age of Onset   Diabetes Mother    Diabetes Father    Heart disease Father     Social History Social History[1]   Allergies   Patient has no known allergies.   Review of Systems Review of Systems   Physical Exam Triage Vital Signs ED Triage Vitals  Encounter Vitals Group     BP      Girls Systolic BP Percentile      Girls Diastolic BP Percentile      Boys Systolic BP Percentile      Boys Diastolic BP Percentile      Pulse      Resp      Temp      Temp src      SpO2      Weight      Height      Head Circumference      Peak Flow      Pain Score       Pain Loc      Pain Education      Exclude from Growth Chart    No data found.  Updated Vital Signs There were no vitals taken for this visit.  Visual Acuity Right Eye Distance:   Left Eye Distance:   Bilateral Distance:    Right Eye Near:   Left Eye Near:    Bilateral Near:     Physical Exam Vitals and nursing note reviewed.  Constitutional:      General: He is not in acute distress.    Appearance: Normal appearance. He is not ill-appearing, toxic-appearing or diaphoretic.  Eyes:     General: No scleral icterus. Cardiovascular:     Rate and Rhythm: Normal rate and regular rhythm.     Heart sounds: Normal heart sounds.  Pulmonary:     Effort: Pulmonary effort is normal. No respiratory distress.     Breath sounds: Normal breath sounds. No wheezing or rhonchi.  Skin:  General: Skin is warm.  Neurological:     Mental Status: He is alert and oriented to person, place, and time.  Psychiatric:        Mood and Affect: Mood normal.        Behavior: Behavior normal.      UC Treatments / Results  Labs (all labs ordered are listed, but only abnormal results are displayed) Labs Reviewed - No data to display  EKG   Radiology No results found.  Procedures Procedures (including critical care time)  Medications Ordered in UC Medications - No data to display  Initial Impression / Assessment and Plan / UC Course  I have reviewed the triage vital signs and the nursing notes.  Pertinent labs & imaging results that were available during my care of the patient were reviewed by me and considered in my medical decision making (see chart for details).     Final Clinical Impressions(s) / UC Diagnoses   Final diagnoses:  None   Discharge Instructions   None    ED Prescriptions   None    PDMP not reviewed this encounter.    [1]  Social History Tobacco Use   Smoking status: Former    Types: Cigarettes   Smokeless tobacco: Never  Substance Use Topics    Alcohol use: Yes    Comment: 1-2/week   Drug use: No     Andra Corean BROCKS, PA-C 06/20/24 1549  "

## 2024-08-10 ENCOUNTER — Ambulatory Visit: Payer: Self-pay | Admitting: Nurse Practitioner
